# Patient Record
Sex: Female | Born: 1960 | Hispanic: Yes | Marital: Single | Smoking: Never smoker
Health system: Southern US, Community
[De-identification: ages and names within clinical notes are randomized; demographics above are authoritative.]

## PROBLEM LIST (undated history)

## (undated) DIAGNOSIS — E785 Hyperlipidemia, unspecified: Secondary | ICD-10-CM

## (undated) DIAGNOSIS — N801 Endometriosis of ovary: Secondary | ICD-10-CM

## (undated) DIAGNOSIS — M810 Age-related osteoporosis without current pathological fracture: Secondary | ICD-10-CM

## (undated) DIAGNOSIS — N80109 Endometriosis of ovary, unspecified side, unspecified depth: Secondary | ICD-10-CM

## (undated) DIAGNOSIS — M8580 Other specified disorders of bone density and structure, unspecified site: Secondary | ICD-10-CM

## (undated) HISTORY — DX: Age-related osteoporosis without current pathological fracture: M81.0

## (undated) HISTORY — DX: Endometriosis of ovary, unspecified side, unspecified depth: N80.109

## (undated) HISTORY — DX: Endometriosis of ovary: N80.1

## (undated) HISTORY — DX: Other specified disorders of bone density and structure, unspecified site: M85.80

## (undated) HISTORY — DX: Hyperlipidemia, unspecified: E78.5

## (undated) HISTORY — PX: OOPHORECTOMY: SHX86

---

## 1998-02-06 HISTORY — PX: SUPRACERVICAL ABDOMINAL HYSTERECTOMY: SHX5393

## 1998-02-06 HISTORY — PX: VAGINAL HYSTERECTOMY: SUR661

## 2008-02-07 HISTORY — PX: CYST REMOVAL HAND: SHX6279

## 2011-06-01 ENCOUNTER — Encounter: Payer: Self-pay | Admitting: Family Medicine

## 2011-07-11 ENCOUNTER — Ambulatory Visit: Payer: Self-pay | Admitting: Family Medicine

## 2011-07-11 ENCOUNTER — Ambulatory Visit (INDEPENDENT_AMBULATORY_CARE_PROVIDER_SITE_OTHER): Payer: Self-pay | Admitting: Family Medicine

## 2011-07-11 ENCOUNTER — Encounter: Payer: Self-pay | Admitting: Family Medicine

## 2011-07-11 VITALS — BP 106/70 | HR 63 | Ht 63.0 in | Wt 143.8 lb

## 2011-07-11 DIAGNOSIS — K5900 Constipation, unspecified: Secondary | ICD-10-CM

## 2011-07-11 DIAGNOSIS — Z0000 Encounter for general adult medical examination without abnormal findings: Secondary | ICD-10-CM

## 2011-07-11 DIAGNOSIS — Z01419 Encounter for gynecological examination (general) (routine) without abnormal findings: Secondary | ICD-10-CM | POA: Insufficient documentation

## 2011-07-11 DIAGNOSIS — E785 Hyperlipidemia, unspecified: Secondary | ICD-10-CM

## 2011-07-11 NOTE — Assessment & Plan Note (Signed)
Likely cause of rectal bleeding.  Instructed on Miralax use.  Will need routine colonoscopy once has orange card.

## 2011-07-11 NOTE — Assessment & Plan Note (Signed)
Last checked 3 months ago in [place]. Taking fish oil.

## 2011-07-11 NOTE — Assessment & Plan Note (Signed)
Despite hysterectomy, pt still needs paps-- still has cervix per pt report.  Will need colonoscopy and mammogram once has orange card.

## 2011-07-11 NOTE — Patient Instructions (Signed)
It was nice to meet you.  Try Miralax for your constipation.  Please come back once you have the orange card for your pap smear and colonoscopy.  We are giving you information for your mammogram.

## 2011-07-11 NOTE — Progress Notes (Signed)
S: Pt comes in today for new patient visit.  Hemorrhoids: BRBPR, has problem with constipation, bought suppository and it helped (3 days ago).  Usually has 1-2 BM per day but they are hard. Has long standing history of problems with constipation.  Has used OTC medicine in the past, unsure of the name.  Past medical, social, family, and surgical histories discussed as above.    ROS: Per HPI  History  Smoking status  . Never Smoker   Smokeless tobacco  . Not on file    O:  Filed Vitals:   07/11/11 1420  BP: 106/70  Pulse: 63    Gen: NAD CV: RRR, no murmur Pulm: CTA bilat, no wheezes or crackles Abd: soft, NT Ext: Warm, no chronic skin changes, no edema   A/P: 51 y.o. female p/w new pt visit, HLD, constipation -See problem list -f/u in once has orange card

## 2011-10-10 ENCOUNTER — Encounter: Payer: Self-pay | Admitting: Family Medicine

## 2011-10-19 ENCOUNTER — Encounter: Payer: Self-pay | Admitting: Family Medicine

## 2011-10-19 ENCOUNTER — Ambulatory Visit (INDEPENDENT_AMBULATORY_CARE_PROVIDER_SITE_OTHER): Payer: Self-pay | Admitting: Family Medicine

## 2011-10-19 ENCOUNTER — Other Ambulatory Visit (HOSPITAL_COMMUNITY)
Admission: RE | Admit: 2011-10-19 | Discharge: 2011-10-19 | Disposition: A | Discharge status: Home / Self Care | Payer: Self-pay | Source: Ambulatory Visit | Attending: Family Medicine | Admitting: Family Medicine

## 2011-10-19 VITALS — BP 105/54 | HR 63 | Temp 98.1°F | Ht 65.0 in | Wt 143.9 lb

## 2011-10-19 DIAGNOSIS — Z23 Encounter for immunization: Secondary | ICD-10-CM

## 2011-10-19 DIAGNOSIS — M8580 Other specified disorders of bone density and structure, unspecified site: Secondary | ICD-10-CM | POA: Insufficient documentation

## 2011-10-19 DIAGNOSIS — K5900 Constipation, unspecified: Secondary | ICD-10-CM

## 2011-10-19 DIAGNOSIS — Z01419 Encounter for gynecological examination (general) (routine) without abnormal findings: Secondary | ICD-10-CM

## 2011-10-19 DIAGNOSIS — K625 Hemorrhage of anus and rectum: Secondary | ICD-10-CM

## 2011-10-19 DIAGNOSIS — E785 Hyperlipidemia, unspecified: Secondary | ICD-10-CM

## 2011-10-19 DIAGNOSIS — Z0000 Encounter for general adult medical examination without abnormal findings: Secondary | ICD-10-CM

## 2011-10-19 DIAGNOSIS — M899 Disorder of bone, unspecified: Secondary | ICD-10-CM

## 2011-10-19 DIAGNOSIS — Z124 Encounter for screening for malignant neoplasm of cervix: Secondary | ICD-10-CM

## 2011-10-19 DIAGNOSIS — E8940 Asymptomatic postprocedural ovarian failure: Secondary | ICD-10-CM | POA: Insufficient documentation

## 2011-10-19 LAB — COMPREHENSIVE METABOLIC PANEL
ALT: 16 U/L (ref 0–35)
AST: 15 U/L (ref 0–37)
Albumin: 4.7 g/dL (ref 3.5–5.2)
BUN: 16 mg/dL (ref 6–23)
Calcium: 9.3 mg/dL (ref 8.4–10.5)
Chloride: 106 mEq/L (ref 96–112)
Potassium: 4.1 mEq/L (ref 3.5–5.3)
Total Protein: 7 g/dL (ref 6.0–8.3)

## 2011-10-19 LAB — CBC
MCHC: 35 g/dL (ref 30.0–36.0)
Platelets: 233 10*3/uL (ref 150–400)
RDW: 12.7 % (ref 11.5–15.5)

## 2011-10-19 LAB — LIPID PANEL
Cholesterol: 232 mg/dL — ABNORMAL HIGH (ref 0–200)
Triglycerides: 97 mg/dL (ref <150)

## 2011-10-19 NOTE — Progress Notes (Signed)
  Subjective:     Patricia Mitchell is a 50 y.o. female and is here for a comprehensive physical exam. The patient reports no problems. -Wondering if she needs to have bone density study-- had one done in [place] ~1 year ago and was "one step away" from osteoporosis; was started on denosumab every 6 months at that time; RF for osteoporosis include: early menopause (surgically induced when pt was in her 41's), otherwise pt is a non-smoker; not on Ca/Vit D   History   Social History  . Marital Status: Single    Spouse Name: N/A    Number of Children: N/A  . Years of Education: N/A   Occupational History  . Not on file.   Social History Main Topics  . Smoking status: Never Smoker   . Smokeless tobacco: Never Used  . Alcohol Use: No  . Drug Use: No  . Sexually Active: No   Other Topics Concern  . Not on file   Social History Narrative   Spanish speakingHeard about [place] from a family memberLives at home with 51 yo brotherLast mammogram in 1996 (normal)Last PAP in 2002 (normal)   Health Maintenance  Topic Date Due  . Pap Smear  04/01/1978  . Tetanus/tdap  04/02/1979  . Mammogram  04/01/2010  . Colonoscopy  04/01/2010  . Influenza Vaccine  11/07/2011    The following portions of the patient's history were reviewed and updated as appropriate: allergies, current medications, past family history, past medical history, past social history, past surgical history and problem list.  Review of Systems Constitutional: negative for fevers, sweats and weight loss Ears, nose, mouth, throat, and face: negative for nasal congestion and sore throat Respiratory: negative for cough and wheezing Cardiovascular: negative for chest pain, chest pressure/discomfort, palpitations and syncope Gastrointestinal: positive for constipation, negative for diarrhea, nausea and vomiting Genitourinary:negative for vaginal discharge and dysuria Integument/breast: negative for breast lump, breast tenderness and nipple  discharge Musculoskeletal:positive for arthralgias, negative for bone pain and muscle weakness Neurological: positive for dizziness and headaches, negative for coordination problems, gait problems and weakness   Objective:    BP 105/54  Pulse 63  Temp 98.1 F (36.7 C) (Oral)  Ht 5\' 5"  (1.651 m)  Wt 143 lb 14.4 oz (65.273 kg)  BMI 23.95 kg/m2 General appearance: alert, cooperative, appears stated age and no distress Head: Normocephalic, without obvious abnormality, atraumatic Throat: lips, mucosa, and tongue normal; teeth and gums normal Neck: no adenopathy, no carotid bruit, supple, symmetrical, trachea midline and thyroid not enlarged, symmetric, no tenderness/mass/nodules Lungs: clear to auscultation bilaterally Heart: regular rate and rhythm, S1, S2 normal, no murmur, click, rub or gallop Abdomen: soft, non-tender; bowel sounds normal; no masses,  no organomegaly Pelvic: cervix normal in appearance, external genitalia normal, no adnexal masses or tenderness, no cervical motion tenderness, rectovaginal septum normal, uterus surgically absent and vagina normal without discharge Extremities: extremities normal, atraumatic, no cyanosis or edema Pulses: 2+ and symmetric Skin: Skin color, texture, turgor normal. No rashes or lesions Neurologic: Grossly normal    Assessment:    Healthy female exam.      Plan:     See After Visit Summary for Counseling Recommendations

## 2011-10-19 NOTE — Assessment & Plan Note (Signed)
Check FLP today. 

## 2011-10-19 NOTE — Assessment & Plan Note (Signed)
Better with increased fiber/fruit in diet

## 2011-10-19 NOTE — Assessment & Plan Note (Signed)
Pap done today- pt with h/o hysterectomy but with cervix intact.  Will also check baseline CMET and CBC. Pt encouraged to get mammogram and will attempt to get colonoscopy.  Pt due to Tdap, which was also given today.

## 2011-10-19 NOTE — Patient Instructions (Addendum)
  It was good to see you today.  I will send you a letter with your lab and pap smear results.  Make sure you get your mammogram!  I will work on getting a colonoscopy set up for you.  We gave you your tetanus shot.  You can take Vitamin D with or without Calcium (over the counter).  We will look into getting a bone scan.  Come back and see me in 1 year, sooner for any issues.

## 2011-10-19 NOTE — Assessment & Plan Note (Signed)
Patient reports bone density scan in [place] ~1 year ago showed that she "almost" had osteoporosis (I would assume osteopenia).  No fractures/broken bones; only risk factor is early surgical menopause when she was in her 30's.  It would be reasonable to get DEXA.  Rec OTC Vit D +/- Ca.  Pt reports being Rx'ed Denosumab q6 months when in [place].

## 2011-10-20 ENCOUNTER — Other Ambulatory Visit: Payer: Self-pay | Admitting: Family Medicine

## 2011-10-20 ENCOUNTER — Encounter: Payer: Self-pay | Admitting: Family Medicine

## 2011-10-20 DIAGNOSIS — Z1231 Encounter for screening mammogram for malignant neoplasm of breast: Secondary | ICD-10-CM

## 2011-10-24 ENCOUNTER — Telehealth: Payer: Self-pay | Admitting: Family Medicine

## 2011-10-24 NOTE — Telephone Encounter (Signed)
I called pt and spoke with sister I LM. Pt hopefully will call us back and we will inform pt about future appt that has been sch.  [employer]

## 2011-10-24 NOTE — Telephone Encounter (Signed)
Thank you, [employer]. Could you mail a letter with the appt info? Thanks, .Arlyss Repress

## 2011-10-25 ENCOUNTER — Encounter: Payer: Self-pay | Admitting: Family Medicine

## 2011-10-26 ENCOUNTER — Encounter: Payer: Self-pay | Admitting: Family Medicine

## 2011-10-31 ENCOUNTER — Telehealth: Payer: Self-pay | Admitting: Family Medicine

## 2011-10-31 MED ORDER — PRAVASTATIN SODIUM 40 MG PO TABS: 40.0000 mg | ORAL_TABLET | Freq: Every day | ORAL | Status: DC

## 2011-10-31 NOTE — Telephone Encounter (Signed)
Will fwd. To Dr.McGill for review. .Jorian [place]  

## 2011-10-31 NOTE — Telephone Encounter (Signed)
Sent to pharmacy 

## 2011-10-31 NOTE — Telephone Encounter (Signed)
After pt received letter where we informed her about high cholesterol pt will like PCP send medicine to the pharmacy that is in our system.   [employer]

## 2011-11-07 ENCOUNTER — Ambulatory Visit (HOSPITAL_COMMUNITY)
Admission: RE | Admit: 2011-11-07 | Discharge: 2011-11-07 | Disposition: A | Discharge status: Home / Self Care | Payer: Self-pay | Source: Ambulatory Visit | Attending: Family Medicine | Admitting: Family Medicine

## 2011-11-07 DIAGNOSIS — Z13820 Encounter for screening for osteoporosis: Secondary | ICD-10-CM | POA: Insufficient documentation

## 2011-11-07 DIAGNOSIS — M899 Disorder of bone, unspecified: Secondary | ICD-10-CM | POA: Insufficient documentation

## 2011-11-07 DIAGNOSIS — E8940 Asymptomatic postprocedural ovarian failure: Secondary | ICD-10-CM

## 2011-11-07 DIAGNOSIS — Z780 Asymptomatic menopausal state: Secondary | ICD-10-CM | POA: Insufficient documentation

## 2011-11-07 DIAGNOSIS — Z1231 Encounter for screening mammogram for malignant neoplasm of breast: Secondary | ICD-10-CM | POA: Insufficient documentation

## 2011-11-07 DIAGNOSIS — M8580 Other specified disorders of bone density and structure, unspecified site: Secondary | ICD-10-CM

## 2011-11-07 IMAGING — MG MM DIGITAL SCREENING BILAT
5 series · 5 of 5 positions shown · non-contrast
Comparison: None.

CLINICAL DATA: Screening.

DIGITAL BILATERAL SCREENING MAMMOGRAM WITH CAD

[R CC]
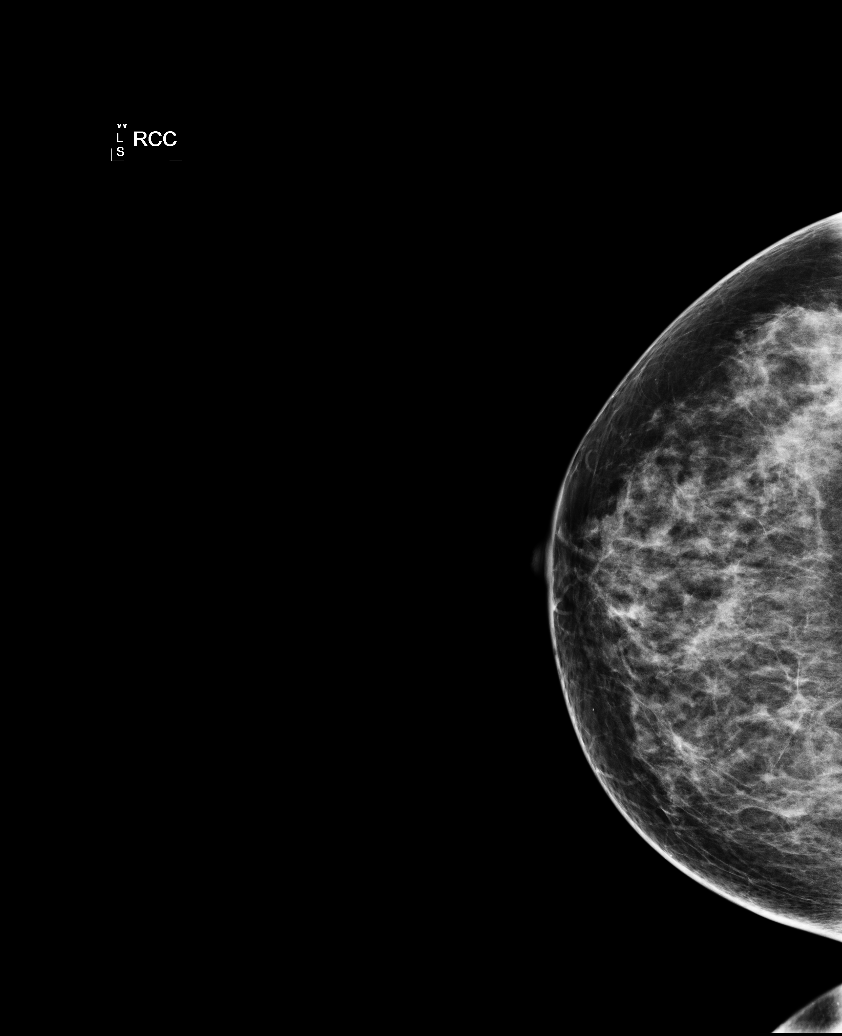

[R MLO]
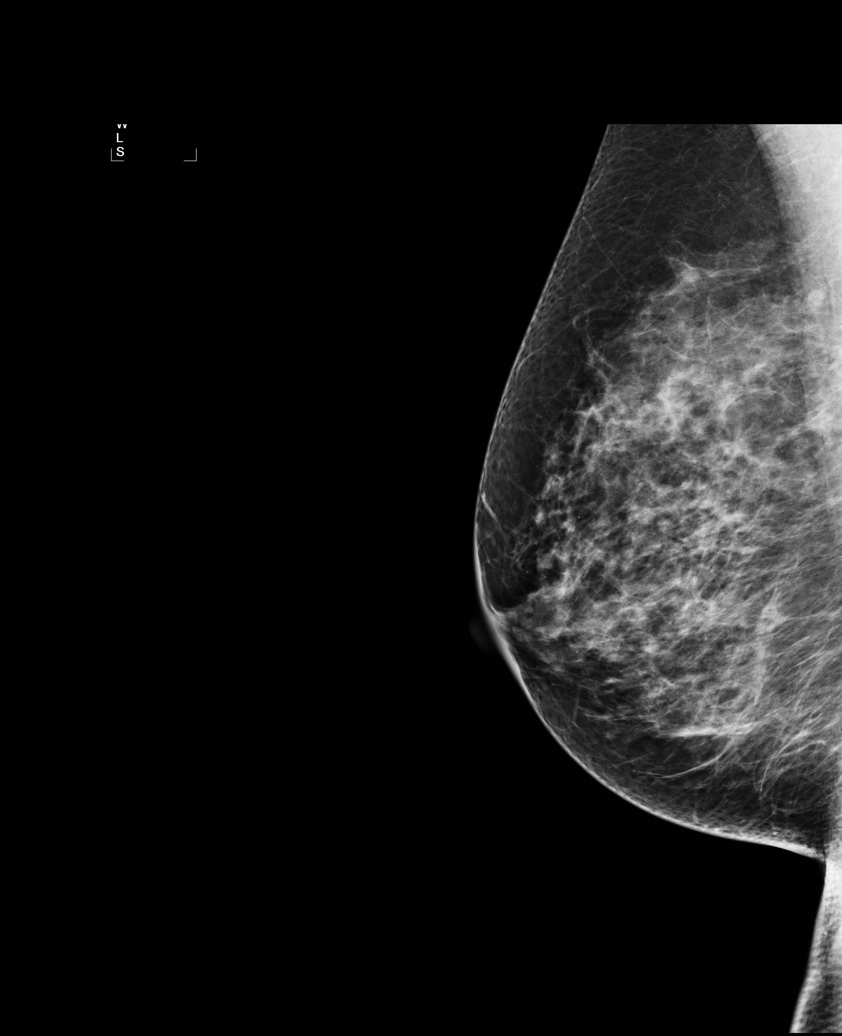

[L CC]
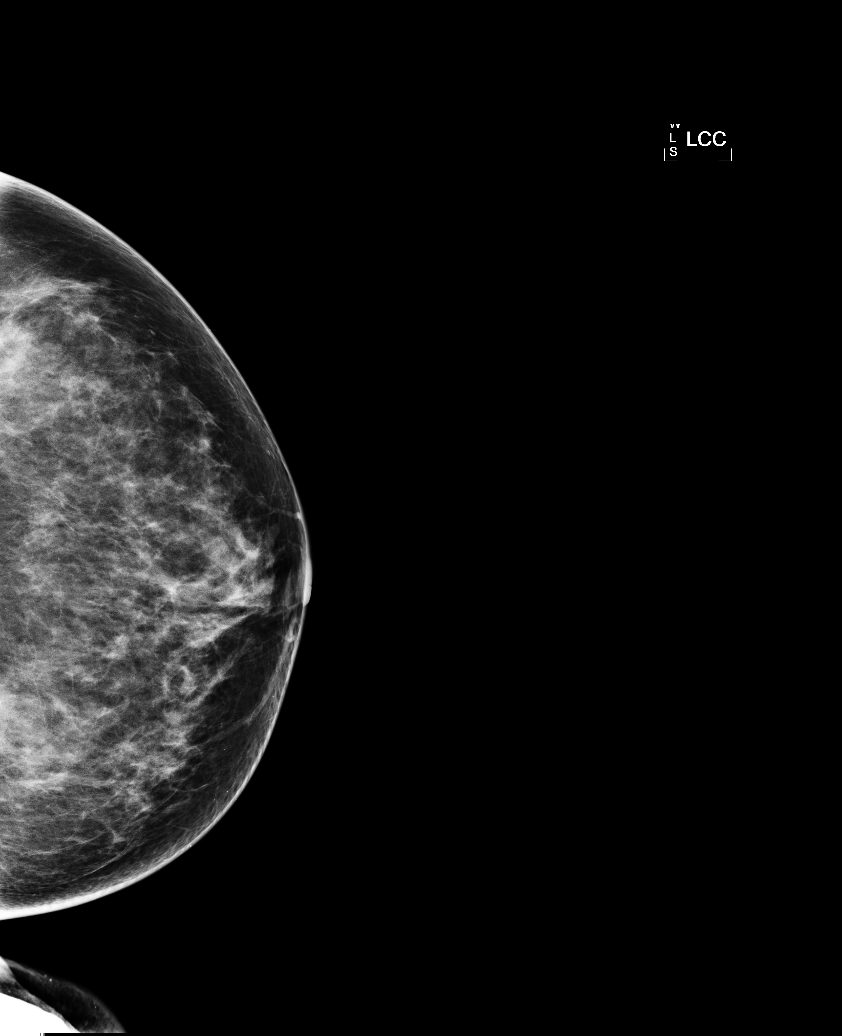

[L MLO (1 of 2)]
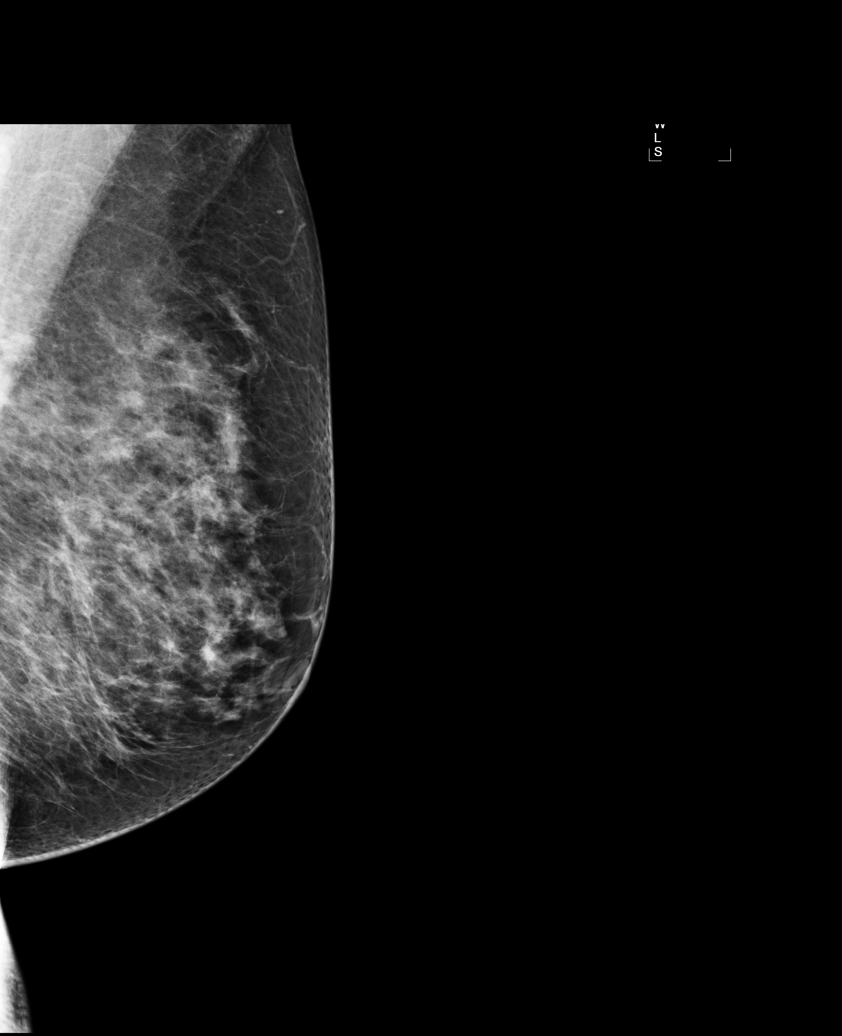

[L MLO (2 of 2)]
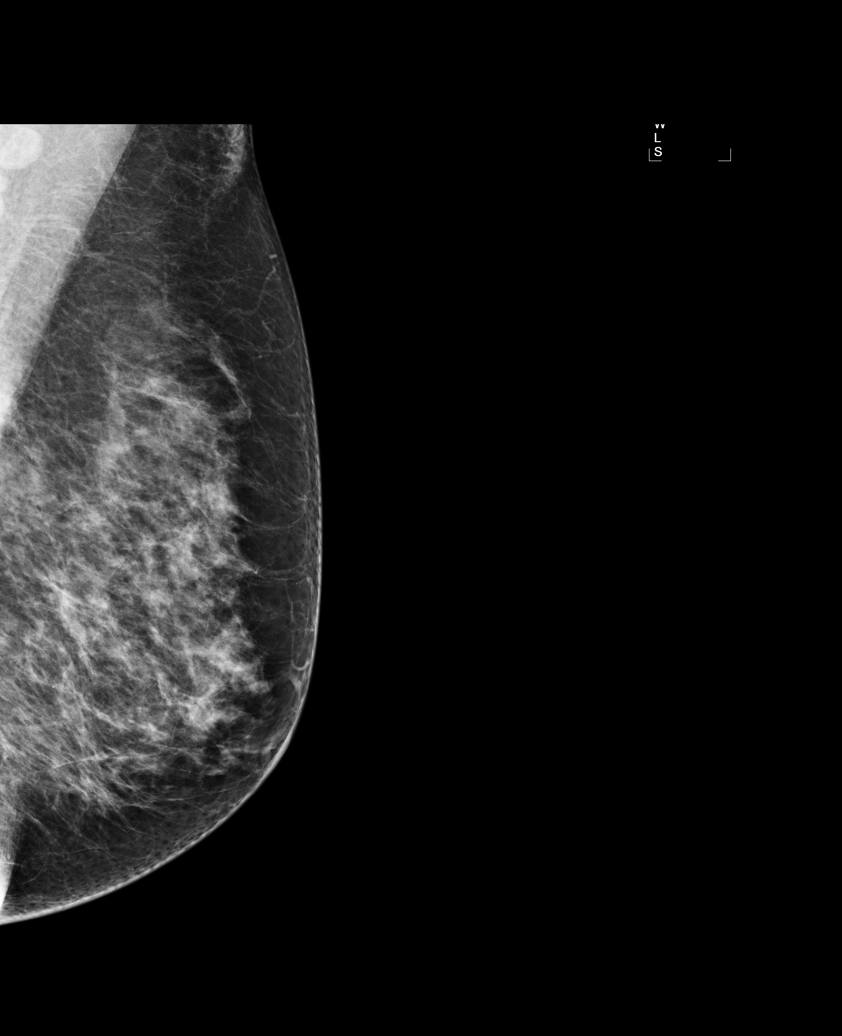

[5 of 5 positions shown; findings below may reference images not displayed]

FINDINGS: The breast tissue is extremely dense. No suspicious
masses, architectural distortion, or calcifications are present.

Images were processed with CAD.
IMPRESSION: No mammographic evidence of malignancy.
A result letter of this screening mammogram will be mailed directly
to the patient.

RECOMMENDATION:
Screening mammogram in one year. (Code:[Z5])

BI-RADS CATEGORY 1:  Negative.

## 2011-11-08 ENCOUNTER — Encounter: Payer: Self-pay | Admitting: Family Medicine

## 2011-11-08 ENCOUNTER — Telehealth: Payer: Self-pay | Admitting: Family Medicine

## 2011-11-08 NOTE — Telephone Encounter (Signed)
Please call pt and let her know that her mammogram was fine. Her bone scan showed osteopenia (not as bad as osteoporosis, but she is at risk for developing this).  I want her to start 600-800IU of vitamin D (was already rec'ed 10/19/11 at [facility] to start over the counter Vit D +/- calcium), weight bearing exercises, and healthy diet to help prevent progression to osteoporosis.  F/u is recommended in 11/2013 (2 years).  Thanks!

## 2011-11-09 NOTE — Telephone Encounter (Signed)
Called patient to deliver message from Dr Fara Boros but she does not speak any English, will forward to [employer] to call patient.Malea Swilling, Rodena Medin

## 2011-11-10 NOTE — Telephone Encounter (Signed)
Pt is aware about Dr's recommendation also about  mammograph result and bone scan result.  [employer]

## 2013-03-26 ENCOUNTER — Encounter: Payer: Self-pay | Admitting: Internal Medicine

## 2013-04-23 ENCOUNTER — Ambulatory Visit (AMBULATORY_SURGERY_CENTER): Payer: Self-pay | Admitting: *Deleted

## 2013-04-23 VITALS — Ht 65.0 in | Wt 154.4 lb

## 2013-04-23 DIAGNOSIS — Z1211 Encounter for screening for malignant neoplasm of colon: Secondary | ICD-10-CM

## 2013-04-23 MED ORDER — MOVIPREP 100 G PO SOLR: ORAL | Status: DC

## 2013-04-23 NOTE — Progress Notes (Signed)
No allergies to eggs or soy. No problems with anesthesia.  Interpreter provided by [facility] present during PV

## 2013-04-25 ENCOUNTER — Encounter: Payer: Self-pay | Admitting: Internal Medicine

## 2013-05-07 ENCOUNTER — Encounter: Payer: Self-pay | Admitting: Internal Medicine

## 2013-05-07 ENCOUNTER — Ambulatory Visit (AMBULATORY_SURGERY_CENTER): Payer: BC Managed Care – PPO | Admitting: Internal Medicine

## 2013-05-07 VITALS — BP 87/46 | HR 51 | Temp 97.5°F | Resp 16 | Ht 65.0 in | Wt 154.0 lb

## 2013-05-07 DIAGNOSIS — Z1211 Encounter for screening for malignant neoplasm of colon: Secondary | ICD-10-CM

## 2013-05-07 DIAGNOSIS — D126 Benign neoplasm of colon, unspecified: Secondary | ICD-10-CM

## 2013-05-07 MED ORDER — SODIUM CHLORIDE 0.9 % IV SOLN: 500.0000 mL | INTRAVENOUS | Status: DC

## 2013-05-07 NOTE — Progress Notes (Signed)
Called to room to assist during endoscopic procedure.  Patient ID and intended procedure confirmed with present staff. Received instructions for my participation in the procedure from the performing physician.Called to room to assist during endoscopic procedure.  Patient ID and intended procedure confirmed with present staff. Received instructions for my participation in the procedure from the performing physician. 

## 2013-05-07 NOTE — Op Note (Signed)
[facility]  [address] [place], [id number]   COLONOSCOPY PROCEDURE REPORT  PATIENT: Patricia Mitchell, Patricia Mitchell  MR#: [id number] BIRTHDATE: [birth year] , 67  yrs. old GENDER: Female ENDOSCOPIST: Jerene Bears, MD REFERRED BY: Sherley Bounds. Wendi Snipes, MD PROCEDURE DATE:  05/07/2013 PROCEDURE:   Colonoscopy with cold biopsy polypectomy First Screening Colonoscopy - Avg.  risk and is 50 yrs.  old or older Yes.  Prior Negative Screening - Now for repeat screening. N/A  History of Adenoma - Now for follow-up colonoscopy & has been > or = to 3 yrs.  N/A  Polyps Removed Today? Yes. ASA CLASS:   Class II INDICATIONS:average risk screening and first colonoscopy. MEDICATIONS: MAC sedation, administered by CRNA and propofol (Diprivan) 250mg  IV  DESCRIPTION OF PROCEDURE:   After the risks benefits and alternatives of the procedure were thoroughly explained, informed consent was obtained.  A digital rectal exam revealed no rectal mass.   The LB [id number]  endoscope was introduced through the anus and advanced to the cecum, which was identified by both the appendix and ileocecal valve. No adverse events experienced. The quality of the prep was good, using MoviPrep  The instrument was then slowly withdrawn as the colon was fully examined.   COLON FINDINGS: A sessile polyp measuring 2 mm in size was found in the ascending colon.  A polypectomy was performed with cold forceps.  The resection was complete and the polyp tissue was completely retrieved.   Mild diverticulosis was noted in the ascending colon and at the hepatic flexure.   Moderate melanosis was found The finding was in the right colon.  Retroflexed views revealed no abnormalities. The time to cecum=2 minutes 08 seconds. Withdrawal time=9 minutes 50 seconds.  The scope was withdrawn and the procedure completed. COMPLICATIONS: There were no complications.  ENDOSCOPIC IMPRESSION: 1.   Sessile polyp measuring 2 mm in size was  found in the ascending colon; polypectomy was performed with cold forceps 2.   Mild diverticulosis was noted in the ascending colon and at the hepatic flexure 3.   Moderate melanosis was found in the right colon  RECOMMENDATIONS: 1.  Await pathology results 2.  High fiber diet 3.  If the polyp removed today is proven to be an adenomatous (pre-cancerous) polyp, you will need a repeat colonoscopy in 5 years.  Otherwise you should continue to follow colorectal cancer screening guidelines for "routine risk" patients with colonoscopy in 10 years.  You will receive a letter within 1-2 weeks with the results of your biopsy as well as final recommendations.  Please call my office if you have not received a letter after 3 weeks.   eSigned:  Jerene Bears, MD 05/07/2013 9:19 AM      cc: The Patient, Sherley Bounds. Wendi Snipes, MD

## 2013-05-07 NOTE — Progress Notes (Signed)
Patient denies any allergies to eggs or soy. Patient denies any problems with anesthesia.  

## 2013-05-07 NOTE — Progress Notes (Signed)
Report to pacu rn, bbs=clear, vss

## 2013-05-07 NOTE — Patient Instructions (Signed)
YOU HAD AN ENDOSCOPIC PROCEDURE TODAY AT THE [facility]: Refer to the procedure report that was given to you for any specific questions about what was found during the examination.  If the procedure report does not answer your questions, please call your gastroenterologist to clarify.  If you requested that your care partner not be given the details of your procedure findings, then the procedure report has been included in a sealed envelope for you to review at your convenience later.  YOU SHOULD EXPECT: Some feelings of bloating in the abdomen. Passage of more gas than usual.  Walking can help get rid of the air that was put into your GI tract during the procedure and reduce the bloating. If you had a lower endoscopy (such as a colonoscopy or flexible sigmoidoscopy) you may notice spotting of blood in your stool or on the toilet paper. If you underwent a bowel prep for your procedure, then you may not have a normal bowel movement for a few days.  DIET: Your first meal following the procedure should be a light meal and then it is ok to progress to your normal diet.  A half-sandwich or bowl of soup is an example of a good first meal.  Heavy or fried foods are harder to digest and may make you feel nauseous or bloated.  Likewise meals heavy in dairy and vegetables can cause extra gas to form and this can also increase the bloating.  Drink plenty of fluids but you should avoid alcoholic beverages for 24 hours.  ACTIVITY: Your care partner should take you home directly after the procedure.  You should plan to take it easy, moving slowly for the rest of the day.  You can resume normal activity the day after the procedure however you should NOT DRIVE or use heavy machinery for 24 hours (because of the sedation medicines used during the test).    SYMPTOMS TO REPORT IMMEDIATELY: A gastroenterologist can be reached at any hour.  During normal business hours, 8:30 AM to 5:00 PM Monday through Friday,  call [phone].  After hours and on weekends, please call the GI answering service at [phone] who will take a message and have the physician on call contact you.   Following lower endoscopy (colonoscopy or flexible sigmoidoscopy):  Excessive amounts of blood in the stool  Significant tenderness or worsening of abdominal pains  Swelling of the abdomen that is new, acute  Fever of 100F or higher    FOLLOW UP: If any biopsies were taken you will be contacted by phone or by letter within the next 1-3 weeks.  Call your gastroenterologist if you have not heard about the biopsies in 3 weeks.  Our staff will call the home number listed on your records the next business day following your procedure to check on you and address any questions or concerns that you may have at that time regarding the information given to you following your procedure. This is a courtesy call and so if there is no answer at the home number and we have not heard from you through the emergency physician on call, we will assume that you have returned to your regular daily activities without incident.  SIGNATURES/CONFIDENTIALITY: You and/or your care partner have signed paperwork which will be entered into your electronic medical record.  These signatures attest to the fact that that the information above on your After Visit Summary has been reviewed and is understood.  Full responsibility of the confidentiality   of this discharge information lies with you and/or your care-partner.     

## 2013-05-08 ENCOUNTER — Telehealth: Payer: Self-pay

## 2013-05-08 NOTE — Telephone Encounter (Signed)
  Follow up Call-  Call back number 05/07/2013  Post procedure Call Back phone  # [phone]  Permission to leave phone message No     Patient questions:  Do you have a fever, pain , or abdominal swelling? no Pain Score  0 *  Have you tolerated food without any problems? yes  Have you been able to return to your normal activities? yes  Do you have any questions about your discharge instructions: Diet   no Medications  no Follow up visit  no  Do you have questions or concerns about your Care? no  Actions: * If pain score is 4 or above: No action needed, pain <4.

## 2013-05-18 ENCOUNTER — Encounter: Payer: Self-pay | Admitting: Internal Medicine

## 2013-05-23 ENCOUNTER — Encounter: Payer: Self-pay | Admitting: Family Medicine

## 2013-05-23 DIAGNOSIS — D369 Benign neoplasm, unspecified site: Secondary | ICD-10-CM | POA: Insufficient documentation

## 2016-04-19 ENCOUNTER — Encounter (HOSPITAL_COMMUNITY): Payer: Self-pay | Admitting: Emergency Medicine

## 2016-04-19 ENCOUNTER — Emergency Department (HOSPITAL_COMMUNITY): Payer: BLUE CROSS/BLUE SHIELD

## 2016-04-19 ENCOUNTER — Emergency Department (HOSPITAL_COMMUNITY)
Admission: EM | Admit: 2016-04-19 | Discharge: 2016-04-19 | Disposition: A | Discharge status: Home / Self Care | Payer: BLUE CROSS/BLUE SHIELD | Attending: Emergency Medicine | Admitting: Emergency Medicine

## 2016-04-19 DIAGNOSIS — Z79899 Other long term (current) drug therapy: Secondary | ICD-10-CM | POA: Insufficient documentation

## 2016-04-19 DIAGNOSIS — J069 Acute upper respiratory infection, unspecified: Secondary | ICD-10-CM | POA: Insufficient documentation

## 2016-04-19 DIAGNOSIS — R05 Cough: Secondary | ICD-10-CM | POA: Diagnosis present

## 2016-04-19 DIAGNOSIS — B9789 Other viral agents as the cause of diseases classified elsewhere: Secondary | ICD-10-CM

## 2016-04-19 MED ORDER — OXYMETAZOLINE HCL 0.05 % NA SOLN: 1.0000 | Freq: Two times a day (BID) | NASAL | 0 refills | Status: DC

## 2016-04-19 NOTE — ED Notes (Signed)
Patient transported to X-ray 

## 2016-04-19 NOTE — ED Provider Notes (Signed)
[place] DEPT Provider Note    By signing my name below, I, Bea Graff, attest that this documentation has been prepared under the direction and in the presence of Harlene Ramus, PA-C. Electronically Signed: Bea Graff, ED Scribe. 04/19/16. 7:44 PM.   History   Chief Complaint Chief Complaint  Patient presents with  . Cough  . Sore Throat   The history is provided by the patient and medical records. No language interpreter was used.    Patricia Mitchell is a 56 y.o. female who presents to the Emergency Department complaining of flu like symptoms for the past 7 weeks. She reports associated productive cough of phlegm, nasal congestion, sinus pain/pressure, sore throat, otalgia, wheezing at night, and generalized body aches. Pt states she has been seen by her PCP three times and was prescribed Hycodan, Tessalon, Promethazine with Codeine and Augmentin that she has been taking for the past four days. She also reports taking Claritin at onset of her symptoms and reports no resolution so she discontinued taking it. She was told she had allergies and bronchitis. She states her symptoms have not improved at all since onset. She denies any modifying factors. She denies fever, chills, drainage from the ears, hemoptysis, abdominal pain, nausea, vomiting, diarrhea, rash, eye itching or drainage, difficulty breathing or swallowing. She denies any recent hospitalizations. She denies any known sick contacts. She has not received a CXR.    Past Medical History:  Diagnosis Date  . Endometriosis of ovary   . Hyperlipidemia     Patient Active Problem List   Diagnosis Date Noted  . Adenomatous polyp 05/23/2013  . Osteopenia 10/19/2011  . 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 Hyperlipidemia 07/11/2011  . Constipation 07/11/2011  . Well woman exam 07/11/2011    Past Surgical History:  Procedure Laterality Date  . CYST REMOVAL HAND Bilateral 2010  . Foley   right taken out in 1995, left taken out in 1997 b/c of cysts  . VAGINAL HYSTERECTOMY  2000    OB History    No data available       Home Medications    Prior to Admission medications   Medication Sig Start Date End Date Taking? Authorizing Provider  Calcium Carb-Cholecalciferol (CALCIUM + D3 PO) Take by mouth daily.    Historical Provider, MD  Cholecalciferol (VITAMIN D PO) Take by mouth daily.    Historical Provider, MD  Misc Natural Products (JOINT SUPPORT PO) Take 2 capsules by mouth daily.    Historical Provider, MD  Multiple Vitamin (MULTIVITAMIN) tablet Take 1 tablet by mouth daily.    Historical Provider, MD  oxymetazoline (AFRIN NASAL SPRAY) 0.05 % nasal spray Place 1 spray into both nostrils 2 (two) times daily. Spray once into each nostril twice daily for up to the next 3 days. Do not use for more than 3 days to prevent rebound rhinorrhea. 04/19/16   Nona Dell, PA-C  RANITIDINE HCL PO Take by mouth daily.    Historical Provider, MD    Family History Family History  Problem Relation Age of Onset  . Prostate cancer Father   . Cervical cancer Mother   . Colon cancer Neg Hx     Social History Social History  Substance Use Topics  . Smoking status: Never Smoker  . Smokeless tobacco: Never Used  . Alcohol use No     Allergies   Patient has no known allergies.   Review of Systems Review of Systems  Constitutional: Negative for chills  and fever.  HENT: Positive for congestion, ear pain, sinus pain, sinus pressure and sore throat. Negative for trouble swallowing.   Eyes: Negative for discharge and itching.  Respiratory: Positive for cough and wheezing. Negative for shortness of breath.   Gastrointestinal: Negative for abdominal pain, nausea and vomiting.  Musculoskeletal: Positive for myalgias.     Physical Exam Updated Vital Signs BP 131/65 (BP Location: Right Arm)   Pulse 66   Temp 98 F (36.7 C) (Oral)   Resp 18   SpO2 98%   Physical  Exam  Constitutional: She is oriented to person, place, and time. She appears well-developed and well-nourished.  HENT:  Head: Normocephalic and atraumatic.  Right Ear: A middle ear effusion is present.  Left Ear: A middle ear effusion is present.  Nose: Rhinorrhea present. Right sinus exhibits maxillary sinus tenderness. Right sinus exhibits no frontal sinus tenderness. Left sinus exhibits maxillary sinus tenderness. Left sinus exhibits no frontal sinus tenderness.  Mouth/Throat: Uvula is midline, oropharynx is clear and moist and mucous membranes are normal. No oropharyngeal exudate, posterior oropharyngeal edema, posterior oropharyngeal erythema or tonsillar abscesses. No tonsillar exudate.  Post nasal drip.  Eyes: Conjunctivae and EOM are normal. Pupils are equal, round, and reactive to light. Right eye exhibits no discharge. Left eye exhibits no discharge. No scleral icterus.  Neck: Normal range of motion. Neck supple.  Cardiovascular: Normal rate, regular rhythm, normal heart sounds and intact distal pulses.  Exam reveals no gallop and no friction rub.   No murmur heard. Pulmonary/Chest: Effort normal and breath sounds normal. No respiratory distress. She has no wheezes. She has no rales. She exhibits no tenderness.  Abdominal: Soft. She exhibits no distension. There is no tenderness.  Musculoskeletal: Normal range of motion. She exhibits no edema.  Lymphadenopathy:    She has no cervical adenopathy.  Neurological: She is alert and oriented to person, place, and time.  Skin: Skin is warm and dry.  Nursing note and vitals reviewed.    ED Treatments / Results  DIAGNOSTIC STUDIES: Oxygen Saturation is 98% on RA, normal by my interpretation.   COORDINATION OF CARE: 7:36 PM- Encouraged pt to continue taking Augmentin. Will prescribe Flonase nasal spray. Explained to pt that imaging is not necessary due to exam but pt requested it. Pt verbalizes understanding and agrees to  plan.  Medications - No data to display  Labs (all labs ordered are listed, but only abnormal results are displayed) Labs Reviewed - No data to display  EKG  EKG Interpretation None       Radiology Dg Chest 2 View  Result Date: 04/19/2016 CLINICAL DATA:  Productive cough EXAM: CHEST  2 VIEW COMPARISON:  None. FINDINGS: The heart size and mediastinal contours are within normal limits. Both lungs are clear. The visualized skeletal structures are unremarkable. IMPRESSION: No active cardiopulmonary disease. Electronically Signed   By: Donavan Foil M.D.   On: 04/19/2016 20:12    Procedures Procedures (including critical care time)  Medications Ordered in ED Medications - No data to display   Initial Impression / Assessment and Plan / ED Course  I have reviewed the triage vital signs and the nursing notes.  Pertinent labs & imaging results that were available during my care of the patient were reviewed by me and considered in my medical decision making (see chart for details).     Patient complaining of symptoms of sinusitis/URI onset 7 weeks ago. Moderate symptoms have been present for greater than 10 days with  purulent nasal discharge and maxillary sinus pain. CXR negative. Concern for acute bacterial rhinosinusitis. Patient discharged with instructions to continue taking her rx of Augmentin. D/c home with rx of decongestant. Instructions given for warm saline nasal wash and recommendations for follow-up with primary care physician. Return precautions discussed.   I personally performed the services described in this documentation, which was scribed in my presence. The recorded information has been reviewed and is accurate.   Final Clinical Impressions(s) / ED Diagnoses   Final diagnoses:  Viral URI with cough    New Prescriptions New Prescriptions   OXYMETAZOLINE (AFRIN NASAL SPRAY) 0.05 % NASAL SPRAY    Place 1 spray into both nostrils 2 (two) times daily. Spray once  into each nostril twice daily for up to the next 3 days. Do not use for more than 3 days to prevent rebound rhinorrhea.     Chesley Noon [place], [place] 04/19/16 2037    Gwenyth Allegra Tegeler, MD 04/19/16 2045

## 2016-04-19 NOTE — Discharge Instructions (Signed)
Take your medication as prescribed to help with your congestion. I recommend continuing to take your cough medication as prescribed. Also continue taking your antibiotics as prescribed until completed. You may also trying drinking warm fluids to help with your congestion.  Please follow up with a primary care provider from the Resource Guide provided below in 4-5 days as needed. Please return to the Emergency Department if symptoms worsen or new onset of fever, facial swelling, difficulty swallowing resulting in drooling, difficulty breathing, chest pain, abdominal pain, vomiting.

## 2016-04-19 NOTE — ED Notes (Signed)
See EDP assessment 

## 2016-04-19 NOTE — ED Triage Notes (Signed)
Pt states for the last 2 weeks she has had URI with productive cough and sore throat and ear pain.

## 2017-01-01 ENCOUNTER — Encounter (INDEPENDENT_AMBULATORY_CARE_PROVIDER_SITE_OTHER): Payer: Self-pay | Admitting: Physician Assistant

## 2017-01-01 ENCOUNTER — Other Ambulatory Visit: Payer: Self-pay

## 2017-01-01 ENCOUNTER — Ambulatory Visit (INDEPENDENT_AMBULATORY_CARE_PROVIDER_SITE_OTHER): Payer: BLUE CROSS/BLUE SHIELD | Admitting: Physician Assistant

## 2017-01-01 VITALS — BP 134/74 | HR 79 | Temp 97.7°F | Wt 155.8 lb

## 2017-01-01 DIAGNOSIS — K8020 Calculus of gallbladder without cholecystitis without obstruction: Secondary | ICD-10-CM

## 2017-01-01 DIAGNOSIS — H8110 Benign paroxysmal vertigo, unspecified ear: Secondary | ICD-10-CM | POA: Diagnosis not present

## 2017-01-01 MED ORDER — NAPROXEN 500 MG PO TABS
500.0000 mg | ORAL_TABLET | Freq: Two times a day (BID) | ORAL | 0 refills | Status: DC
Start: 2017-01-01 — End: 2017-06-04

## 2017-01-01 MED ORDER — URSODIOL 250 MG PO TABS: 250.0000 mg | ORAL_TABLET | Freq: Two times a day (BID) | ORAL | 11 refills | Status: DC | PRN

## 2017-01-01 NOTE — Patient Instructions (Signed)
Vrtigo posicional benigno (Benign Positional Vertigo) El vrtigo es la sensacin de que usted o todo lo que lo rodea se mueven cuando en realidad eso no sucede. El vrtigo posicional benigno es el tipo de vrtigo ms comn. La causa de este trastorno no es grave (es benigna). Algunos movimientos y determinadas posiciones pueden desencadenar el trastorno (es posicional). El vrtigo posicional benigno puede ser peligroso si ocurre mientras est haciendo algo que podra suponer un riesgo para usted y para los dems, por ejemplo, conduciendo un automvil. CAUSAS En muchos de los casos, se desconoce la causa de este trastorno. Puede deberse a una alteracin en una zona del odo interno que ayuda al cerebro a percibir el movimiento y a coordinar el equilibrio. Esta alteracin puede deberse a una infeccin viral (laberintitis), a una lesin en la cabeza o a los movimientos reiterados. FACTORES DE RIESGO Es ms probable que esta afeccin se manifieste en:  Las mujeres.  Las personas mayores de 50aos. SNTOMAS Generalmente, los sntomas de este trastorno se presentan al mover la cabeza o los ojos en diferentes direcciones. Pueden aparecer repentinamente y suelen durar menos de un minuto. Entre los sntomas se pueden incluir los siguientes:  Prdida del equilibrio y cadas.  Sensacin de estar dando vueltas o movindose.  Sensacin de que el entorno est dando vueltas o movindose.  Nuseas y vmitos.  Visin borrosa.  Mareos.  Movimientos oculares involuntarios (nistagmo). Los sntomas pueden ser leves y algo fastidiosos, o pueden ser graves e interferir en la vida cotidiana. Los episodios de vrtigo posicional benigno pueden repetirse (ser recurrentes) a lo largo del tiempo, y algunos movimientos pueden desencadenarlos. Los sntomas pueden mejorar con el tiempo. DIAGNSTICO Generalmente, este trastorno se diagnostica con una historia clnica y un examen fsico de la cabeza, el cuello y los  odos. Tal vez lo deriven a un mdico especialista en problemas de la garganta, la nariz y el odo (otorrinolaringlogo), o a uno que se especializa en trastornos del sistema nervioso (neurlogo). Pueden hacerle otros estudios, entre ellos:  Resonancia magntica.  Tomografa computarizada.  Estudios de los movimientos oculares. El mdico puede pedirle que cambie rpidamente de posicin mientras observa si se presentan sntomas de vrtigo posicional benigno, por ejemplo, nistagmo. Los movimientos oculares se pueden estudiar con una electronistagmografa (ENG), con estimulacin trmica, mediante la maniobra de Dix-Hallpike o con la prueba de rotacin.  Electroencefalograma (EEG). Este estudio registra la actividad elctrica del cerebro.  Pruebas de audicin. TRATAMIENTO Generalmente, para tratar este trastorno, el mdico le har movimientos especficos con la cabeza para que el odo interno se normalice. Cuando los casos son graves, tal vez haya que realizar una ciruga, pero esto no es frecuente. En algunos casos, el vrtigo posicional benigno se resuelve por s solo en el trmino de 2 o 4semanas. INSTRUCCIONES PARA EL CUIDADO EN EL HOGAR Seguridad  Muvase lentamente.No haga movimientos bruscos con el cuerpo o con la cabeza.  No conduzca.  No opere maquinaria pesada.  No haga ninguna tarea que podra ser peligrosa para usted o para otras personas en caso de que ocurriera un episodio de vrtigo.  Si tiene dificultad para caminar o mantener el equilibrio, use un bastn para mantener la estabilidad. Si se siente mareado o inestable, sintese de inmediato.  Reanude sus actividades normales como se lo haya indicado el mdico. Pregntele al mdico qu actividades son seguras para usted. Instrucciones generales  Tome los medicamentos de venta libre y los recetados solamente como se lo haya indicado el mdico.    Evite algunas posiciones o determinados movimientos como se lo haya indicado el  mdico.  Beba suficiente lquido para mantener la orina clara o de color amarillo plido.  Concurra a todas las visitas de control como se lo haya indicado el mdico. Esto es importante. SOLICITE ATENCIN MDICA SI:  Patricia Mitchell.  El trastorno [name], o le aparecen sntomas nuevos.  Sus familiares o amigos advierten cambios en su comportamiento.  Las nuseas o los vmitos empeoran.  Tiene sensacin de hormigueo o de adormecimiento. SOLICITE ATENCIN MDICA DE INMEDIATO SI:  Tiene dificultad para hablar o para moverse.  Esta mareado todo [name].  Se desmaya.  Tiene dolores de cabeza intensos.  Tiene debilidad en los brazos o las piernas.  Tiene cambios en la audicin o la visin.  Siente rigidez en el cuello.  Tiene sensibilidad a Naval architect. Esta informacin no tiene Marine scientist el consejo del mdico. Asegrese de hacerle al mdico cualquier pregunta que tenga. Document Released: 05/11/2008 Document Revised: 10/14/2014 Document Reviewed: 05/18/2014 Elsevier Interactive Patient Education  2018 [place] (Cholelithiasis) La colelitiasis (tambin llamada clculos en la vescula) es una enfermedad de la vescula. La vescula es un rgano pequeo que ayuda a digerir las [place]. Los sntomas de clculos en la vescula son:  Feliberto Harts de vomitar (nuseas).  Devolver la comida (vomitar).  Dolor en el vientre.  Piel amarilla (ictericia)  Dolor sbito. Podr sentir Conservation officer, historic buildings durante algunos minutos o unas horas.  Cristy Hilts.  Dolor a Secretary/administrator. CUIDADOS EN EL HOGAR  Tome slo los medicamentos segn le haya indicado el mdico.  Siga una dieta baja en grasas hasta que vuelva a ver al mdico nuevamente. Consumir grasas puede Engineer, drilling.  Concurra a las consultas de control con el mdico, segn las indicaciones. Los ataques generalmente ocurren repetidas veces. Generalmente se requiere de [employer].  SOLICITE AYUDA DE  INMEDIATO SI:  El dolor empeora.  El dolor no se alivia con los [employer].  Tiene fiebre o sntomas que persisten durante ms de 2-3 das.  Tiene fiebre y los sntomas empeoran repentinamente.  Siente nuseas y vomita.  ASEGRESE DE QUE:  Comprende estas instrucciones.  Controlar su afeccin.  Recibir ayuda de inmediato si no mejora o si empeora.  Esta informacin no tiene Marine scientist el consejo del mdico. Asegrese de hacerle al mdico cualquier pregunta que tenga. Document Released: 04/21/2008 Document Revised: 09/25/2012 Document Reviewed: 07/17/2012 Elsevier Interactive Patient Education  2017 [employer].

## 2017-01-01 NOTE — Progress Notes (Signed)
Subjective:  Patient ID: Patricia Mitchell, female    DOB: 10-Nov-1960  Age: 56 y.o. MRN: [id number]  CC: gallstones  HPI Patricia Mitchell is a 56 y.o. female with a medical history of endometriosis and hyperlipidemia presents as a new patient to f/u on cholelithiasis. Had an US done at [facility] on 12/01/16 that revealed cholelithiasis without cholecystitis and revealed fatty liver. Patient asks if surgery is recommended. Used to take gallstone dissolution agent from [place] but has not done so for a few years. She is currently asymptomatic.     Had vertigo upon awakening for two out of the last three days. No vertigo this morning. Does not feel ill, denies headache, no tinnitus, and no loss of audition. No other complaints or symptoms.      Outpatient Medications Prior to Visit  Medication Sig Dispense Refill  . Calcium Carb-Cholecalciferol (CALCIUM + D3 PO) Take by mouth daily.    . Cholecalciferol (VITAMIN D PO) Take by mouth daily.    . Misc Natural Products (JOINT SUPPORT PO) Take 2 capsules by mouth daily.    . Multiple Vitamin (MULTIVITAMIN) tablet Take 1 tablet by mouth daily.    Marland Kitchen oxymetazoline (AFRIN NASAL SPRAY) 0.05 % nasal spray Place 1 spray into both nostrils 2 (two) times daily. Spray once into each nostril twice daily for up to the next 3 days. Do not use for more than 3 days to prevent rebound rhinorrhea. (Patient not taking: Reported on 01/01/2017) 30 mL 0  . RANITIDINE HCL PO Take by mouth daily.     No facility-administered medications prior to visit.      ROS Review of Systems  Constitutional: Negative for chills, fever and malaise/fatigue.  Eyes: Negative for blurred vision.  Respiratory: Negative for shortness of breath.   Cardiovascular: Negative for chest pain and palpitations.  Gastrointestinal: Negative for abdominal pain and nausea.  Genitourinary: Negative for dysuria and hematuria.  Musculoskeletal: Negative for joint pain and myalgias.   Skin: Negative for rash.  Neurological: Negative for tingling and headaches.  Psychiatric/Behavioral: Negative for depression. The patient is not nervous/anxious.     Objective:  BP 134/74 (BP Location: Left Arm, Patient Position: Sitting, Cuff Size: Normal)   Pulse 79   Temp 97.7 F (36.5 C) (Oral)   Wt 155 lb 12.8 oz (70.7 kg)   SpO2 96%   BMI 25.93 kg/m   BP/Weight 01/01/2017 3/47/4259 06/11/3873  Systolic BP 643 329 87  Diastolic BP 74 65 46  Wt. (Lbs) 155.8 - 154  BMI 25.93 - 25.63      Physical Exam  Constitutional: She is oriented to person, place, and time.  Well developed, well nourished, NAD, polite  HENT:  Head: Normocephalic and atraumatic.  TMs normal  Eyes: No scleral icterus.  Neck: Normal range of motion. Neck supple. No thyromegaly present.  Cardiovascular: Normal rate, regular rhythm and normal heart sounds.  Pulmonary/Chest: Effort normal and breath sounds normal.  Abdominal: Soft. Bowel sounds are normal. There is no tenderness.  Musculoskeletal: She exhibits no edema.  Neurological: She is alert and oriented to person, place, and time. No cranial nerve deficit. Coordination normal.  Marye Round test negative bilaterally  Skin: Skin is warm and dry. No rash noted. No erythema. No pallor.  Psychiatric: She has a normal mood and affect. Her behavior is normal. Thought content normal.  Vitals reviewed.    Assessment & Plan:   1. Calculus of gallbladder without cholecystitis without obstruction - ursodiol (  ACTIGALL) 250 MG tablet; Take 1 tablet (250 mg total) by mouth 2 (two) times daily as needed.  Dispense: 60 tablet; Refill: 11 - naproxen (NAPROSYN) 500 MG tablet; Take 1 tablet (500 mg total) by mouth 2 (two) times daily with a meal.  Dispense: 30 tablet; Refill: 0  2. Benign paroxysmal positional vertigo, unspecified laterality - Educated patient on BPPV, Dix Hallpike negative in clinic. Advised to call if vertigo persists.   Meds ordered  this encounter  Medications  . ursodiol (ACTIGALL) 250 MG tablet    Sig: Take 1 tablet (250 mg total) by mouth 2 (two) times daily as needed.    Dispense:  60 tablet    Refill:  11    Order Specific Question:   Supervising Provider    Answer:   Tresa Garter [id number]  . naproxen (NAPROSYN) 500 MG tablet    Sig: Take 1 tablet (500 mg total) by mouth 2 (two) times daily with a meal.    Dispense:  30 tablet    Refill:  0    Order Specific Question:   Supervising Provider    Answer:   Tresa Garter [[id number]]    Follow-up: Return in about 4 weeks (around 01/29/2017) for full physical.   Clent Demark PA

## 2017-02-22 ENCOUNTER — Encounter: Payer: Self-pay | Admitting: Physician Assistant

## 2017-06-04 ENCOUNTER — Other Ambulatory Visit (HOSPITAL_COMMUNITY)
Admission: RE | Admit: 2017-06-04 | Discharge: 2017-06-04 | Disposition: A | Discharge status: Home / Self Care | Payer: BLUE CROSS/BLUE SHIELD | Source: Ambulatory Visit | Attending: Physician Assistant | Admitting: Physician Assistant

## 2017-06-04 ENCOUNTER — Ambulatory Visit (INDEPENDENT_AMBULATORY_CARE_PROVIDER_SITE_OTHER): Payer: BLUE CROSS/BLUE SHIELD | Admitting: Physician Assistant

## 2017-06-04 ENCOUNTER — Other Ambulatory Visit: Payer: Self-pay

## 2017-06-04 ENCOUNTER — Encounter (INDEPENDENT_AMBULATORY_CARE_PROVIDER_SITE_OTHER): Payer: Self-pay | Admitting: Physician Assistant

## 2017-06-04 VITALS — BP 113/72 | HR 75 | Temp 98.3°F | Ht 65.0 in | Wt 159.0 lb

## 2017-06-04 DIAGNOSIS — Z1159 Encounter for screening for other viral diseases: Secondary | ICD-10-CM | POA: Diagnosis not present

## 2017-06-04 DIAGNOSIS — Z0000 Encounter for general adult medical examination without abnormal findings: Secondary | ICD-10-CM | POA: Diagnosis not present

## 2017-06-04 DIAGNOSIS — Z113 Encounter for screening for infections with a predominantly sexual mode of transmission: Secondary | ICD-10-CM | POA: Insufficient documentation

## 2017-06-04 DIAGNOSIS — Z114 Encounter for screening for human immunodeficiency virus [HIV]: Secondary | ICD-10-CM

## 2017-06-04 DIAGNOSIS — Z124 Encounter for screening for malignant neoplasm of cervix: Secondary | ICD-10-CM | POA: Insufficient documentation

## 2017-06-04 DIAGNOSIS — M8580 Other specified disorders of bone density and structure, unspecified site: Secondary | ICD-10-CM

## 2017-06-04 DIAGNOSIS — L821 Other seborrheic keratosis: Secondary | ICD-10-CM

## 2017-06-04 MED ORDER — ALENDRONATE SODIUM 35 MG PO TABS: 35.0000 mg | ORAL_TABLET | ORAL | 3 refills | Status: DC

## 2017-06-04 NOTE — Patient Instructions (Signed)
Queratosis seborreica  Seborrheic Keratosis  La queratosis seborreica es un trastorno frecuente y no canceroso (benigno) que se caracteriza por la presencia de tumores cutneos. Esta afeccin hace que aparezcan manchas cerosas, speras, marrones o negras en la piel. Estos tumores cutneos pueden ser planos o tener relieve.  Cules son las causas?  Se desconoce la causa de esta afeccin.  Qu incrementa el riesgo?  Es ms probable que esta afeccin se manifieste en:   Las personas que tienen antecedentes familiares de queratosis seborreica.   Las mayores de 50aos.   Las embarazadas.   Las personas que han recibido un tratamiento de reposicin con estrgenos.    Cules son los signos o los sntomas?  Esta afeccin a menudo se presenta en la cara, el pecho, los hombros, la espalda u otras reas. Estos tumores presentan las siguientes caractersticas:   Por lo general, son indoloros, pero pueden irritarse y picar.   Pueden ser de color amarillo, marrn, negro, o de otros colores.   Tienen un poco de relieve o son planos.   A veces, son speros o tienen una textura similar a la de una verruga.   A menudo, tienen un aspecto seroso en la superficie.   Son redondos u ovalados.   A veces, se ven como si estuviesen "pegados".   Suelen aparecer en grupos, pero pueden crecer de forma individual.    Cmo se diagnostica?  Esta afeccin se diagnostica mediante sus antecedentes mdicos y un examen fsico. Puede analizarse una muestra de un tumor (biopsia de piel). Tal vez haya que consultar a un especialista en piel (dermatlogo).  Cmo se trata?  Por lo general, esta afeccin no requiere tratamiento, excepto si los tumores se irritan o sangran con frecuencia. Si no le gusta el aspecto de estos tumores, puede solicitarle al mdico que se los extirpe. Con frecuencia, estos tumores se tratan mediante un procedimiento en el que se aplica nitrgeno lquido para "congelar" el tumor (criociruga). Tambin  pueden quemarse con electricidad o extirparse.  Siga estas instrucciones en su casa:   Controle los tumores para ver si hay cambios.   Concurra a todas las visitas de control como se lo haya indicado el mdico. Esto es importante.   No se rasque ni toquetee el tumor o los tumores. Esto puede hacer que se irriten o infecten.  Comunquese con un mdico si:   Le aparecen muchos tumores nuevos de forma repentina.   El tumor sangra, le pica o le duele.   El tumor se agranda o cambia de color de forma repentina.  Esta informacin no tiene como fin reemplazar el consejo del mdico. Asegrese de hacerle al mdico cualquier pregunta que tenga.  Document Released: 10/05/2010 Document Revised: 04/18/2016 Document Reviewed: 06/10/2014  Elsevier Interactive Patient Education  2018 Elsevier Inc.

## 2017-06-04 NOTE — Progress Notes (Signed)
Subjective:  Patient ID: Patricia Mitchell, female    DOB: 04/24/1960  Age: 57 y.o. MRN: [id number]  CC: annual physical  HPI Patricia Mitchell is a 57 y.o. female with a medical history of endometriosis and hyperlipidemia presents for an annual physical. Says she had a partial hysterectomy and did not have cervix removed. Denies any vaginal bleeding or discharge. No pelvic pain. Complains of two skin lesion on the right flank that concern her. Lesions have seemed to grow over time. Also brings copies of her bone density studies from two years ago. She has a T score of -2.1 at the worst. Used to take alendronate weekly but has run out. Does not endorse any other complaints or symptoms.     Outpatient Medications Prior to Visit  Medication Sig Dispense Refill  . Calcium Carb-Cholecalciferol (CALCIUM + D3 PO) Take by mouth daily.    . Cholecalciferol (VITAMIN D PO) Take by mouth daily.    . Misc Natural Products (JOINT SUPPORT PO) Take 2 capsules by mouth daily.    . Multiple Vitamin (MULTIVITAMIN) tablet Take 1 tablet by mouth daily.    Marland Kitchen RANITIDINE HCL PO Take by mouth daily.    . naproxen (NAPROSYN) 500 MG tablet Take 1 tablet (500 mg total) by mouth 2 (two) times daily with a meal. 30 tablet 0  . oxymetazoline (AFRIN NASAL SPRAY) 0.05 % nasal spray Place 1 spray into both nostrils 2 (two) times daily. Spray once into each nostril twice daily for up to the next 3 days. Do not use for more than 3 days to prevent rebound rhinorrhea. (Patient not taking: Reported on 01/01/2017) 30 mL 0  . ursodiol (ACTIGALL) 250 MG tablet Take 1 tablet (250 mg total) by mouth 2 (two) times daily as needed. 60 tablet 11   No facility-administered medications prior to visit.      ROS Review of Systems  Constitutional: Negative for chills, fever and malaise/fatigue.  Eyes: Negative for blurred vision.  Respiratory: Negative for shortness of breath.   Cardiovascular: Negative for chest pain and palpitations.   Gastrointestinal: Negative for abdominal pain and nausea.  Genitourinary: Negative for dysuria and hematuria.  Musculoskeletal: Negative for joint pain and myalgias.  Skin: Negative for rash.       Few skin lesions  Neurological: Negative for tingling and headaches.  Psychiatric/Behavioral: Negative for depression. The patient is not nervous/anxious.     Objective:  BP 113/72 (BP Location: Left Arm, Patient Position: Sitting, Cuff Size: Normal)   Pulse 75   Temp 98.3 F (36.8 C) (Oral)   Ht 5\' 5"  (1.651 m)   Wt 159 lb (72.1 kg)   SpO2 97%   BMI 26.46 kg/m   BP/Weight 06/04/2017 01/01/2017 4/66/5993  Systolic BP 570 177 939  Diastolic BP 72 74 65  Wt. (Lbs) 159 155.8 -  BMI 26.46 25.93 -      Physical Exam  Constitutional: She is oriented to person, place, and time.  Well developed, well nourished, NAD, polite  HENT:  Head: Normocephalic and atraumatic.  Mouth/Throat: No oropharyngeal exudate.  Eyes: Pupils are equal, round, and reactive to light. Conjunctivae and EOM are normal. No scleral icterus.  Neck: Normal range of motion. Neck supple. No thyromegaly present.  Cardiovascular: Normal rate, regular rhythm and normal heart sounds.  No LE edema bilaterally  Pulmonary/Chest: Effort normal and breath sounds normal. No respiratory distress.  Abdominal: Soft. Bowel sounds are normal. She exhibits no distension and no mass. There is  no tenderness.  Musculoskeletal: She exhibits no edema.  Back with full aROM and without elicitation of pain  Lymphadenopathy:    She has no cervical adenopathy.  Neurological: She is alert and oriented to person, place, and time. No cranial nerve deficit.  Patellar DTR 2+ bilaterally. Strength 5/5 throughout.   Skin: Skin is warm and dry. No rash noted. No erythema. No pallor.  Psychiatric: She has a normal mood and affect. Her behavior is normal. Thought content normal.  Vitals reviewed.    Assessment & Plan:   1. Annual physical  exam - CBC with Differential - Comprehensive metabolic panel - TSH - Lipid panel  2. Screening for cervical cancer - Cytology - PAP([place])  3. Need for hepatitis C screening test - Hepatitis c antibody (reflex)  4. Screening for HIV (human immunodeficiency virus) - HIV antibody  5. Seborrheic keratosis - Ambulatory referral to Dermatology  6. Osteopenia, unspecified location - Begin alendronate (FOSAMAX) 35 MG tablet; Take 1 tablet (35 mg total) by mouth every 7 (seven) days. Take with a full glass of water on an empty stomach.  Dispense: 12 tablet; Refill: 3 - DG Bone Density; Future   Meds ordered this encounter  Medications  . alendronate (FOSAMAX) 35 MG tablet    Sig: Take 1 tablet (35 mg total) by mouth every 7 (seven) days. Take with a full glass of water on an empty stomach.    Dispense:  12 tablet    Refill:  3    Order Specific Question:   Supervising Provider    Answer:   Tresa Garter [id number]    Follow-up: Return in about 6 months (around 12/04/2017) for lipid panel.   Clent Demark PA

## 2017-06-05 ENCOUNTER — Other Ambulatory Visit (INDEPENDENT_AMBULATORY_CARE_PROVIDER_SITE_OTHER): Payer: Self-pay | Admitting: Physician Assistant

## 2017-06-05 LAB — COMPREHENSIVE METABOLIC PANEL
ALT: 25 IU/L (ref 0–32)
AST: 16 IU/L (ref 0–40)
Albumin/Globulin Ratio: 2 (ref 1.2–2.2)
Albumin: 4.4 g/dL (ref 3.5–5.5)
Alkaline Phosphatase: 90 IU/L (ref 39–117)
BUN/Creatinine Ratio: 18 (ref 9–23)
BUN: 13 mg/dL (ref 6–24)
Bilirubin Total: <0.2 mg/dL (ref 0.0–1.2)
CALCIUM: 9 mg/dL (ref 8.7–10.2)
CO2: 22 mmol/L (ref 20–29)
Chloride: 106 mmol/L (ref 96–106)
Creatinine, Ser: 0.71 mg/dL (ref 0.57–1.00)
GFR calc Af Amer: 109 mL/min/{1.73_m2} (ref >59)
GFR, EST NON AFRICAN AMERICAN: 95 mL/min/{1.73_m2} (ref >59)
GLUCOSE: 97 mg/dL (ref 65–99)
Globulin, Total: 2.2 g/dL (ref 1.5–4.5)
POTASSIUM: 4.1 mmol/L (ref 3.5–5.2)
Sodium: 139 mmol/L (ref 134–144)
TOTAL PROTEIN: 6.6 g/dL (ref 6.0–8.5)

## 2017-06-05 LAB — TSH: TSH: 1.82 u[IU]/mL (ref 0.450–4.500)

## 2017-06-05 LAB — CBC WITH DIFFERENTIAL/PLATELET
Basophils Absolute: 0 10*3/uL (ref 0.0–0.2)
Basos: 0 %
EOS (ABSOLUTE): 0.1 10*3/uL (ref 0.0–0.4)
Eos: 2 %
Hematocrit: 38.6 % (ref 34.0–46.6)
Hemoglobin: 13.4 g/dL (ref 11.1–15.9)
IMMATURE GRANS (ABS): 0 10*3/uL (ref 0.0–0.1)
IMMATURE GRANULOCYTES: 0 %
Lymphocytes Absolute: 1.8 10*3/uL (ref 0.7–3.1)
Lymphs: 29 %
MCH: 32.1 pg (ref 26.6–33.0)
MCHC: 34.7 g/dL (ref 31.5–35.7)
MCV: 93 fL (ref 79–97)
MONOS ABS: 0.4 10*3/uL (ref 0.1–0.9)
Monocytes: 6 %
NEUTROS PCT: 63 %
Neutrophils Absolute: 3.9 10*3/uL (ref 1.4–7.0)
Platelets: 257 10*3/uL (ref 150–379)
RBC: 4.17 x10E6/uL (ref 3.77–5.28)
RDW: 13 % (ref 12.3–15.4)
WBC: 6.1 10*3/uL (ref 3.4–10.8)

## 2017-06-05 LAB — CYTOLOGY - PAP
BACTERIAL VAGINITIS: NEGATIVE
Candida vaginitis: NEGATIVE
Chlamydia: NEGATIVE
Diagnosis: NEGATIVE
NEISSERIA GONORRHEA: NEGATIVE
TRICH (WINDOWPATH): NEGATIVE

## 2017-06-05 LAB — LIPID PANEL
Chol/HDL Ratio: 4.7 ratio — ABNORMAL HIGH (ref 0.0–4.4)
Cholesterol, Total: 207 mg/dL — ABNORMAL HIGH (ref 100–199)
HDL: 44 mg/dL (ref >39)
TRIGLYCERIDES: 427 mg/dL — AB (ref 0–149)

## 2017-06-05 LAB — HIV ANTIBODY (ROUTINE TESTING W REFLEX): HIV Screen 4th Generation wRfx: NONREACTIVE

## 2017-06-05 LAB — HCV COMMENT:

## 2017-06-05 LAB — HEPATITIS C ANTIBODY (REFLEX)

## 2017-06-05 MED ORDER — FENOFIBRATE 145 MG PO TABS: 145.0000 mg | ORAL_TABLET | Freq: Every day | ORAL | 5 refills | Status: DC

## 2017-06-06 ENCOUNTER — Telehealth (INDEPENDENT_AMBULATORY_CARE_PROVIDER_SITE_OTHER): Payer: Self-pay

## 2017-06-06 NOTE — Telephone Encounter (Signed)
Using [phone]) with pacific interpreters patient has voicemail that is not set up yet. Will try calling back. Nat Christen, CMA

## 2017-06-06 NOTE — Telephone Encounter (Signed)
-----   Message from Clent Demark, PA-C sent at 06/05/2017 12:21 PM EDT ----- Triglycerides are highly elevated. I have sent fenofibrate to [facility].

## 2017-06-08 ENCOUNTER — Encounter (INDEPENDENT_AMBULATORY_CARE_PROVIDER_SITE_OTHER): Payer: Self-pay

## 2017-06-08 ENCOUNTER — Telehealth (INDEPENDENT_AMBULATORY_CARE_PROVIDER_SITE_OTHER): Payer: Self-pay

## 2017-06-08 NOTE — Telephone Encounter (Signed)
-----   Message from Clent Demark, PA-C sent at 06/05/2017 12:21 PM EDT ----- Triglycerides are highly elevated. I have sent fenofibrate to [facility].

## 2017-06-08 NOTE — Telephone Encounter (Signed)
Patients voicemail not set up yet. Mailed results. Patricia Mitchell, [place] interpreter [phone])

## 2017-07-17 ENCOUNTER — Ambulatory Visit
Admission: RE | Admit: 2017-07-17 | Discharge: 2017-07-17 | Disposition: A | Discharge status: Home / Self Care | Payer: BLUE CROSS/BLUE SHIELD | Source: Ambulatory Visit | Attending: Physician Assistant | Admitting: Physician Assistant

## 2017-07-17 DIAGNOSIS — M8580 Other specified disorders of bone density and structure, unspecified site: Secondary | ICD-10-CM

## 2017-08-29 ENCOUNTER — Ambulatory Visit
Admission: RE | Admit: 2017-08-29 | Discharge: 2017-08-29 | Disposition: A | Discharge status: Home / Self Care | Payer: BLUE CROSS/BLUE SHIELD | Source: Ambulatory Visit | Attending: Physician Assistant | Admitting: Physician Assistant

## 2017-08-29 ENCOUNTER — Other Ambulatory Visit (INDEPENDENT_AMBULATORY_CARE_PROVIDER_SITE_OTHER): Payer: Self-pay | Admitting: Physician Assistant

## 2017-08-31 ENCOUNTER — Telehealth (INDEPENDENT_AMBULATORY_CARE_PROVIDER_SITE_OTHER): Payer: Self-pay

## 2017-08-31 NOTE — Telephone Encounter (Signed)
-----   Message from Clent Demark, PA-C sent at 08/29/2017  5:56 PM EDT ----- Osteopenia. Take alendronate 35 mg as directed.

## 2017-08-31 NOTE — Telephone Encounter (Signed)
Call placed using pacific interpreter [phone]) patient is aware of osteopenia and to take alendronate 35 mg as directed. Patient expressed understanding. Nat Christen, CMA

## 2017-12-10 ENCOUNTER — Ambulatory Visit (INDEPENDENT_AMBULATORY_CARE_PROVIDER_SITE_OTHER): Payer: BLUE CROSS/BLUE SHIELD | Admitting: Physician Assistant

## 2017-12-10 ENCOUNTER — Encounter (INDEPENDENT_AMBULATORY_CARE_PROVIDER_SITE_OTHER): Payer: Self-pay | Admitting: Physician Assistant

## 2017-12-10 VITALS — BP 111/67 | HR 66 | Temp 97.8°F | Ht 65.0 in | Wt 160.4 lb

## 2017-12-10 DIAGNOSIS — T7840XA Allergy, unspecified, initial encounter: Secondary | ICD-10-CM

## 2017-12-10 DIAGNOSIS — E781 Pure hyperglyceridemia: Secondary | ICD-10-CM | POA: Diagnosis not present

## 2017-12-10 MED ORDER — CETIRIZINE HCL 10 MG PO TABS: 10.0000 mg | ORAL_TABLET | Freq: Every day | ORAL | 11 refills | Status: DC

## 2017-12-10 MED ORDER — FLUTICASONE PROPIONATE 50 MCG/ACT NA SUSP: 2.0000 | Freq: Every day | NASAL | 6 refills | Status: DC

## 2017-12-10 NOTE — Progress Notes (Signed)
Subjective:  Patient ID: Patricia Mitchell, female    DOB: [birth year]  Age: 57 y.o. MRN: [id number]  CC: f/u lipid panel  HPI Patricia Mitchell a 57 y.o.femalewith a medical history of endometriosis and hyperlipidemia presents to f/u on her hypertriglyceridemia. Last triglyceride level 427 mg/dL approximately six months ago. She has been taking fenofibrate 145 mg qday as directed. Denies epigastric pain, nausea, vomiting, fever, chills, constipation, diarrhea, abdominal bloating, LE edema,  CP, palpitations, SOB, HA, or GU sxs.     Only complaint is of chronic mucus production attributed to allergies. Has recently introduced a cat to her home. Is not taking antihistamines or using nasal sprays.       Outpatient Medications Prior to Visit  Medication Sig Dispense Refill  . Calcium Carb-Cholecalciferol (CALCIUM + D3 PO) Take by mouth daily.    . Cholecalciferol (VITAMIN D PO) Take by mouth daily.    . Misc Natural Products (JOINT SUPPORT PO) Take 2 capsules by mouth daily.    . Multiple Vitamin (MULTIVITAMIN) tablet Take 1 tablet by mouth daily.    Marland Kitchen alendronate (FOSAMAX) 35 MG tablet Take 1 tablet (35 mg total) by mouth every 7 (seven) days. Take with a full glass of water on an empty stomach. (Patient not taking: Reported on 12/10/2017) 12 tablet 3  . fenofibrate (TRICOR) 145 MG tablet Take 1 tablet (145 mg total) by mouth daily. (Patient not taking: Reported on 12/10/2017) 30 tablet 5  . RANITIDINE HCL PO Take by mouth daily.     No facility-administered medications prior to visit.      ROS Review of Systems  Constitutional: Negative for chills, fever and malaise/fatigue.  HENT: Positive for congestion.   Eyes: Negative for blurred vision.  Respiratory: Negative for shortness of breath.   Cardiovascular: Negative for chest pain and palpitations.  Gastrointestinal: Negative for abdominal pain and nausea.  Genitourinary: Negative for dysuria and hematuria.  Musculoskeletal: Negative for  joint pain and myalgias.  Skin: Negative for rash.  Neurological: Negative for tingling and headaches.  Psychiatric/Behavioral: Negative for depression. The patient is not nervous/anxious.     Objective:  BP 111/67 (BP Location: Left Arm, Patient Position: Sitting, Cuff Size: Normal)   Pulse 66   Temp 97.8 F (36.6 C) (Oral)   Ht 5\' 5"  (1.651 m)   Wt 160 lb 6.4 oz (72.8 kg)   SpO2 97%   BMI 26.69 kg/m   BP/Weight 12/10/2017 06/04/2017 54/27/0623  Systolic BP 762 831 517  Diastolic BP 67 72 74  Wt. (Lbs) 160.4 159 155.8  BMI 26.69 26.46 25.93      Physical Exam  Constitutional: She is oriented to person, place, and time.  Well developed, well nourished, NAD, polite  HENT:  Head: Normocephalic and atraumatic.  Mouth/Throat: No oropharyngeal exudate.  Turbinates mildly hypertrophic bilaterally  Eyes: No scleral icterus.  Neck: Normal range of motion. Neck supple. No thyromegaly present.  Cardiovascular: Normal rate, regular rhythm and normal heart sounds.  No LE edema bilaterally  Pulmonary/Chest: Effort normal and breath sounds normal.  Abdominal: Soft. She exhibits no distension. There is no tenderness.  Musculoskeletal: She exhibits no edema.  Lymphadenopathy:    She has no cervical adenopathy.  Neurological: She is alert and oriented to person, place, and time.  Skin: Skin is warm and dry. No rash noted. No erythema. No pallor.  Psychiatric: She has a normal mood and affect. Her behavior is normal. Thought content normal.  Vitals reviewed.    Assessment &  Plan:   1. Hypertriglyceridemia - Lipid panel; Future - Basic Metabolic Panel; Future  2. Allergic state, initial encounter - Begin cetirizine (ZYRTEC) 10 MG tablet; Take 1 tablet (10 mg total) by mouth daily.  Dispense: 30 tablet; Refill: 11 - Begin fluticasone (FLONASE) 50 MCG/ACT nasal spray; Place 2 sprays into both nostrils daily.  Dispense: 16 g; Refill: 6 - Pt foregoes allergen zone 2 testing due to  financial reasons.   Meds ordered this encounter  Medications  . cetirizine (ZYRTEC) 10 MG tablet    Sig: Take 1 tablet (10 mg total) by mouth daily.    Dispense:  30 tablet    Refill:  11    Order Specific Question:   Supervising Provider    Answer:   Charlott Rakes [[id number]]  . fluticasone (FLONASE) 50 MCG/ACT nasal spray    Sig: Place 2 sprays into both nostrils daily.    Dispense:  16 g    Refill:  6    Order Specific Question:   Supervising Provider    Answer:   Charlott Rakes [[id number]]    Follow-up: Return in about 6 months (around 06/10/2018) for hypertriglyceridemia.   Clent Demark PA

## 2017-12-10 NOTE — Patient Instructions (Signed)
Opciones de alimentos para Therapist, nutritional de triglicridos (Food Choices to Lower Your Triglycerides) Los triglicridos son un tipo de grasas que se Chief Strategy Officer. Un nivel elevado de triglicridos puede aumentar el riesgo de padecer enfermedades cardacas e infartos. Si sus niveles de triglicridos son altos, los alimentos que se ingieren y los hbitos de alimentacin son Theatre stage manager. Elegir los alimentos adecuados puede ayudar a Therapist, nutritional de triglicridos. [place]?  Baje de peso si es necesario.  Limite o evite el alcohol.  Llene la mitad del plato con vegetales y ensaladas de hojas verdes.  Terry a dos porciones por da. Elija frutas en lugar de jugos.  Ocupe un cuarto del plato con cereales integrales. Busque la palabra "integral" en Equities trader de la lista de ingredientes.  Llene un cuarto del plato con alimentos con protenas magras.  Disfrute de pescados grasos (como salmn, caballa, sardinas y atn) tres veces por semana.  Tracy grasas saludables.  Limite los alimentos con alto contenido de almidn y Location manager.  Consuma ms comida casera y menos de restaurante, de buf y comida rpida.  Limite el consumo de alimentos fritos.  Cocine los alimentos utilizando mtodos que no sean la fritura.  Limite el consumo de grasas saturadas.  Verifique las listas de ingredientes para evitar alimentos con aceites parcialmente hidrogenados (grasas trans).  QU ALIMENTOS PUEDO COMER? Cereales Cereales integrales, como los panes de salvado o [place], las [place], los cereales y las pastas. Avena sin endulzar, trigo, [place], quinua o arroz integral. Tortillas de harina de maz o de salvado. Vegetales Verduras frescas o congeladas (crudas, al vapor, asadas o grilladas). Ensaladas de hojas verdes. Fruits Frutas frescas, en conserva (en su jugo natural) o frutas congeladas. Carnes y otros productos con protenas Carne de  res molida (al 85% o ms [place]), carne de res de animales alimentados con pastos o carne de res sin la grasa. Pollo o pavo sin piel. Carne de pollo o de [place]. Cerdo sin la grasa. Todos los pescados y frutos de mar. Huevos. Porotos, guisantes o lentejas secos. Frutos secos o semillas sin sal. Frijoles secos o en lata sin sal. Lcteos Productos lcteos con bajo contenido de grasas, como [place] o al 1%, quesos reducidos en grasas o al 2%, ricota con bajo contenido de grasas o [employer], o yogur natural con bajo contenido de [place]. Grasas y Naval architect en barra que no contengan grasas trans. [place] y condimentos para ensaladas livianos o reducidos en grasas. [place]. Aceites de crtamo, oliva o canola. [place] natural de man o almendra. Los artculos mencionados arriba pueden no ser [employer] de las bebidas o los alimentos recomendados. Comunquese con el nutricionista para conocer ms opciones. QU ALIMENTOS NO SE RECOMIENDAN? Cereales Pan blanco. Pastas blancas. Arroz blanco. Pan de maz. Bagels, pasteles y croissants. Galletas saladas que contengan grasas trans. Vegetales Papas blancas. Maz. Vegetales con crema o fritos. Verduras en [place]. Fruits Frutas secas. Fruta enlatada en almbar liviano o espeso. Jugo de frutas. Carnes y otros productos con protenas Cort[place] de carne con Lobbyist. Costillas, alas de pollo, tocineta, salchicha, mortadela, salame, chinchulines, tocino, perros calientes, salchichas alemanas y embutidos envasados. Lcteos Leche entera o al 2%, crema, mezcla de [place] y crema y queso crema. Yogur entero o endulzado. Quesos con toda su grasa. [place]remas no lcteas y coberturas batidas. Quesos procesados, quesos para untar o cuajadas. Dulces y postres Jarabe de maz, azcares, miel  y Control and instrumentation engineer. Caramelos. Mermelada y [place]. Chrissie Noa. Cereales endulzados. Galletas, pasteles, bizcochuelos, donas, muffins y helado. Grasas y  aceites [place], [place] en barra, [place] de [place], [place], [place] clarificada o grasa de tocino. Aceites de coco, de palmiste o de palma. Bebidas Alcohol. Bebidas endulzadas (como refrescos, limonadas y bebidas frutales o ponches). Los artculos mencionados arriba pueden no ser [employer] de las bebidas y los alimentos que se Higher education careers adviser. Comunquese con el nutricionista para recibir ms informacin. Esta informacin no tiene Marine scientist el consejo del mdico. Asegrese de hacerle al mdico cualquier pregunta que tenga. Document Released: 07/13/2009 Document Revised: 01/28/2013 Document Reviewed: 11/27/2012 Elsevier Interactive Patient Education  2017 [employer].

## 2017-12-17 ENCOUNTER — Other Ambulatory Visit (INDEPENDENT_AMBULATORY_CARE_PROVIDER_SITE_OTHER): Payer: Self-pay | Admitting: Physician Assistant

## 2017-12-17 ENCOUNTER — Other Ambulatory Visit (INDEPENDENT_AMBULATORY_CARE_PROVIDER_SITE_OTHER): Payer: BLUE CROSS/BLUE SHIELD

## 2017-12-17 DIAGNOSIS — E781 Pure hyperglyceridemia: Secondary | ICD-10-CM

## 2017-12-17 NOTE — Progress Notes (Signed)
Labs collected by onsite phlebotomist. Nat Christen

## 2017-12-17 NOTE — Telephone Encounter (Signed)
90 day supply request. Nat Christen, CMA

## 2017-12-18 ENCOUNTER — Other Ambulatory Visit (INDEPENDENT_AMBULATORY_CARE_PROVIDER_SITE_OTHER): Payer: Self-pay | Admitting: Physician Assistant

## 2017-12-18 DIAGNOSIS — E7841 Elevated Lipoprotein(a): Secondary | ICD-10-CM

## 2017-12-18 LAB — BASIC METABOLIC PANEL
BUN/Creatinine Ratio: 14 (ref 9–23)
BUN: 14 mg/dL (ref 6–24)
CALCIUM: 9.4 mg/dL (ref 8.7–10.2)
CO2: 23 mmol/L (ref 20–29)
CREATININE: 1.01 mg/dL — AB (ref 0.57–1.00)
Chloride: 105 mmol/L (ref 96–106)
GFR calc Af Amer: 71 mL/min/{1.73_m2} (ref >59)
GFR calc non Af Amer: 62 mL/min/{1.73_m2} (ref >59)
GLUCOSE: 87 mg/dL (ref 65–99)
POTASSIUM: 4 mmol/L (ref 3.5–5.2)
SODIUM: 143 mmol/L (ref 134–144)

## 2017-12-18 LAB — LIPID PANEL
CHOLESTEROL TOTAL: 199 mg/dL (ref 100–199)
Chol/HDL Ratio: 3.9 ratio (ref 0.0–4.4)
HDL: 51 mg/dL (ref >39)
LDL CALC: 122 mg/dL — AB (ref 0–99)
Triglycerides: 132 mg/dL (ref 0–149)
VLDL Cholesterol Cal: 26 mg/dL (ref 5–40)

## 2017-12-18 MED ORDER — ATORVASTATIN CALCIUM 40 MG PO TABS: 40.0000 mg | ORAL_TABLET | Freq: Every day | ORAL | 3 refills | Status: DC

## 2017-12-19 ENCOUNTER — Telehealth (INDEPENDENT_AMBULATORY_CARE_PROVIDER_SITE_OTHER): Payer: Self-pay

## 2017-12-19 NOTE — Telephone Encounter (Signed)
-----   Message from Clent Demark, PA-C sent at 12/18/2017  8:46 AM EST ----- Pt's triglycerides are back to normal. LDL is elevated and I recommend she take atorvastatin 40 mg before bedtime. I have sent the prescription to [place] at [facility].

## 2017-12-19 NOTE — Telephone Encounter (Signed)
Call placed using pacific interpreter [phone]) left message asking patient to return call to [facility] at [phone]. Will attempt to call patient once more before mailing results. If patient returns call please notify her that her triglycerides are back to normal. LDL( bad cholesterol) is elevated. PCP recommends she take atorvastatin 40 mg before bedtime. Prescription for atorvastatin has been sent to [facility] on [address]. Nat Christen, CMA

## 2017-12-20 ENCOUNTER — Telehealth (INDEPENDENT_AMBULATORY_CARE_PROVIDER_SITE_OTHER): Payer: Self-pay

## 2017-12-20 NOTE — Telephone Encounter (Signed)
Call placed using pacific interpreter [phone]) patient is aware that triglycerides are back to normal but LDL is elevated. Atorvastatin 40 mg has been to [facility]. Take atorvastatin 40 mg daily before bedtime. Patient expressed understanding and did not have any additional questions. Nat Christen, CMA

## 2017-12-20 NOTE — Telephone Encounter (Signed)
-----   Message from Clent Demark, PA-C sent at 12/18/2017  8:46 AM EST ----- Pt's triglycerides are back to normal. LDL is elevated and I recommend she take atorvastatin 40 mg before bedtime. I have sent the prescription to [place] at [facility].

## 2018-04-22 ENCOUNTER — Telehealth (INDEPENDENT_AMBULATORY_CARE_PROVIDER_SITE_OTHER): Payer: Self-pay | Admitting: Physician Assistant

## 2018-04-22 NOTE — Telephone Encounter (Signed)
Patient called requesting a refill on alendronate (FOSAMAX) 35 MG tablet Patient was offered an appt for the refill but she stated to send a message first. Please f/u.

## 2018-04-23 ENCOUNTER — Encounter: Payer: Self-pay | Admitting: Internal Medicine

## 2018-04-24 ENCOUNTER — Other Ambulatory Visit (INDEPENDENT_AMBULATORY_CARE_PROVIDER_SITE_OTHER): Payer: Self-pay | Admitting: *Deleted

## 2018-04-24 DIAGNOSIS — M8580 Other specified disorders of bone density and structure, unspecified site: Secondary | ICD-10-CM

## 2018-04-24 MED ORDER — ALENDRONATE SODIUM 35 MG PO TABS: 35.0000 mg | ORAL_TABLET | ORAL | 3 refills | Status: DC

## 2018-06-21 ENCOUNTER — Encounter: Payer: Self-pay | Admitting: Internal Medicine

## 2018-07-19 ENCOUNTER — Encounter: Payer: BLUE CROSS/BLUE SHIELD | Admitting: Internal Medicine

## 2019-02-11 ENCOUNTER — Encounter (HOSPITAL_COMMUNITY): Payer: Self-pay | Admitting: Emergency Medicine

## 2019-02-11 ENCOUNTER — Ambulatory Visit (HOSPITAL_COMMUNITY)
Admission: EM | Admit: 2019-02-11 | Discharge: 2019-02-11 | Disposition: A | Discharge status: Home / Self Care | Payer: 59

## 2019-02-11 ENCOUNTER — Other Ambulatory Visit: Payer: Self-pay

## 2019-02-11 DIAGNOSIS — M546 Pain in thoracic spine: Secondary | ICD-10-CM | POA: Diagnosis not present

## 2019-02-11 MED ORDER — CYCLOBENZAPRINE HCL 10 MG PO TABS: 5.0000 mg | ORAL_TABLET | Freq: Every day | ORAL | 0 refills | Status: DC

## 2019-02-11 MED ORDER — NAPROXEN 500 MG PO TABS: 500.0000 mg | ORAL_TABLET | Freq: Two times a day (BID) | ORAL | 0 refills | Status: DC

## 2019-02-11 NOTE — ED Triage Notes (Addendum)
Back pain for 9 days.  Denies fall.  Upper back pain.  Radiating around epigastric area.  Raising body up from lying on back is painful.  No pain with ambulation or adl.  Pain is worse when leaning to the right rather than the left.  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  This pain started 1 hour after vacuuming.

## 2019-02-11 NOTE — ED Notes (Signed)
translator equipment currently in use

## 2019-02-11 NOTE — ED Provider Notes (Signed)
[place]    CSN: [id number] Arrival date & time: 02/11/19  1035      History   Chief Complaint No chief complaint on file.   HPI Patricia Mitchell is a 59 y.o. female.   Patient is otherwise healthy 59 year old female presents today with right upper back pain and chest pain.  Symptoms have been intermittent over the past 9 days.  Pain is worse with going from lying to sitting position.  Feels like a sharp pain or pulling.  Started after doing a a lot of vacuuming with a backpack vacuum.  She has been taking ibuprofen from [place] without much relief.  No chest pain or shortness of breath.  No cough, chest congestion or fevers.  No falls or injuries to the back.  No numbness, tingling or weakness.  ROS per HPI      Past Medical History:  Diagnosis Date  . Endometriosis of ovary   . Hyperlipidemia     Patient Active Problem List   Diagnosis Date Noted  . Adenomatous polyp 05/23/2013  . Osteopenia 10/19/2011  . 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 Hyperlipidemia 07/11/2011  . Constipation 07/11/2011  . Well woman exam 07/11/2011    Past Surgical History:  Procedure Laterality Date  . CYST REMOVAL HAND Bilateral 2010  . [place]   right taken out in 1995, left taken out in 1997 b/c of cysts  . VAGINAL HYSTERECTOMY  2000    OB History   No obstetric history on file.      Home Medications    Prior to Admission medications   Medication Sig Start Date End Date Taking? Authorizing Provider  atorvastatin (LIPITOR) 40 MG tablet Take 1 tablet (40 mg total) by mouth daily. 12/18/17  Yes Clent Demark, PA-C  Calcium Carb-Cholecalciferol (CALCIUM + D3 PO) Take by mouth daily.   Yes [provider]  Cholecalciferol (VITAMIN D PO) Take by mouth daily.   Yes [provider]  Multiple Vitamin (MULTIVITAMIN) tablet Take 1 tablet by mouth daily.   Yes [provider]  NON FORMULARY hormone   Yes [provider]  cyclobenzaprine (FLEXERIL) 10 MG tablet Take 0.5 tablets (5 mg total) by mouth at bedtime. 02/11/19   Orvan July, NP  Misc Natural Products (JOINT SUPPORT PO) Take 2 capsules by mouth daily.    [provider]  naproxen (NAPROSYN) 500 MG tablet Take 1 tablet (500 mg total) by mouth 2 (two) times daily. 02/11/19   Loura Halt A, NP  cetirizine (ZYRTEC) 10 MG tablet Take 1 tablet (10 mg total) by mouth daily. 12/10/17 02/11/19  Clent Demark, PA-C  fluticasone [facility]) 50 MCG/ACT nasal spray Place 2 sprays into both nostrils daily. 12/10/17 02/11/19  Clent Demark, PA-C    Family History Family History  Problem Relation Age of Onset  . Prostate cancer Father   . Cervical cancer Mother   . Colon cancer Neg Hx     Social History Social History   Tobacco Use  . Smoking status: Never Smoker  . Smokeless tobacco: Never Used  Substance Use Topics  . Alcohol use: No  . Drug use: No     Allergies   Patient has no known allergies.   Review of Systems Review of Systems   Physical Exam Triage Vital Signs ED Triage Vitals  Enc Vitals Group     BP 02/11/19 1136 122/71     Pulse Rate 02/11/19 1136 87  Resp 02/11/19 1136 18     Temp 02/11/19 1136 98.1 F (36.7 C)     Temp Source 02/11/19 1136 Oral     SpO2 02/11/19 1136 98 %     Weight --      Height --      Head Circumference --      Peak Flow --      Pain Score 02/11/19 1211 10     Pain Loc --      Pain Edu? --      Excl. in [place]? --    No data found.  Updated Vital Signs BP 122/71 (BP Location: Left Arm)   Pulse 87   Temp 98.1 F (36.7 C) (Oral)   Resp 18   SpO2 98%   Visual Acuity Right Eye Distance:   Left Eye Distance:   Bilateral Distance:    Right Eye Near:   Left Eye Near:    Bilateral Near:     Physical Exam Vitals and nursing note reviewed.  Constitutional:      General: She is not in acute distress.    Appearance: Normal appearance. She is not ill-appearing,  toxic-appearing or diaphoretic.  HENT:     Head: Normocephalic.     Nose: Nose normal.     Mouth/Throat:     Pharynx: Oropharynx is clear.  Eyes:     Conjunctiva/sclera: Conjunctivae normal.  Cardiovascular:     Rate and Rhythm: Normal rate and regular rhythm.  Pulmonary:     Effort: Pulmonary effort is normal.     Breath sounds: Normal breath sounds.  Abdominal:     Palpations: Abdomen is soft.     Tenderness: There is no abdominal tenderness.  Musculoskeletal:        General: Normal range of motion.     Cervical back: Normal range of motion.     Thoracic back: No swelling, edema, deformity, signs of trauma, lacerations, spasms, tenderness or bony tenderness. Normal range of motion. No scoliosis.       Back:     Comments: Non tender with palpation. Reporting pain in marked area with bending backward and forward.   Skin:    General: Skin is warm and dry.     Findings: No rash.  Neurological:     Mental Status: She is alert.  Psychiatric:        Mood and Affect: Mood normal.      UC Treatments / Results  Labs (all labs ordered are listed, but only abnormal results are displayed) Labs Reviewed - No data to display  EKG   Radiology No results found.  Procedures Procedures (including critical care time)  Medications Ordered in UC Medications - No data to display  Initial Impression / Assessment and Plan / UC Course  I have reviewed the triage vital signs and the nursing notes.  Pertinent labs & imaging results that were available during my care of the patient were reviewed by me and considered in my medical decision making (see chart for details).     Symptoms consistent with muscle strain/soreness.  Most likely from wearing the back pain vacuum.  We will try a low dose muscle relaxant to see if this helps.  Gentle stretching, heat and massage Naproxen for pain and inflammation.  Follow up as needed for continued or worsening symptoms  Final Clinical  Impressions(s) / UC Diagnoses   Final diagnoses:  Acute right-sided thoracic back pain     Discharge Instructions  Take the medication as prescribed.  Be aware the Flexeril may make you drowsy.  You can take half a tab to full tab at bedtime as needed Naproxen twice a day for pain and inflammation This is most likely a muscle strain Follow up as needed for continued or worsening symptoms     ED Prescriptions    Medication Sig Dispense Auth. Provider   cyclobenzaprine (FLEXERIL) 10 MG tablet Take 0.5 tablets (5 mg total) by mouth at bedtime. 20 tablet Jenene Kauffmann A, NP   naproxen (NAPROSYN) 500 MG tablet Take 1 tablet (500 mg total) by mouth 2 (two) times daily. 30 tablet Loura Halt A, NP     PDMP not reviewed this encounter.   Orvan July, NP 02/12/19 1007

## 2019-02-11 NOTE — Discharge Instructions (Addendum)
Take the medication as prescribed.  Be aware the Flexeril may make you drowsy.  You can take half a tab to full tab at bedtime as needed Naproxen twice a day for pain and inflammation This is most likely a muscle strain Follow up as needed for continued or worsening symptoms

## 2019-06-11 ENCOUNTER — Telehealth: Payer: Self-pay | Admitting: Family Medicine

## 2019-06-11 DIAGNOSIS — E781 Pure hyperglyceridemia: Secondary | ICD-10-CM

## 2019-06-11 DIAGNOSIS — Z1329 Encounter for screening for other suspected endocrine disorder: Secondary | ICD-10-CM

## 2019-06-11 DIAGNOSIS — Z1321 Encounter for screening for nutritional disorder: Secondary | ICD-10-CM

## 2019-06-11 DIAGNOSIS — E7841 Elevated Lipoprotein(a): Secondary | ICD-10-CM

## 2019-06-11 DIAGNOSIS — Z0000 Encounter for general adult medical examination without abnormal findings: Secondary | ICD-10-CM

## 2019-06-11 NOTE — Telephone Encounter (Signed)
Please order labs for physical comimg up 08/12/2019 pt is on nurses schedule for 08/08/2019 same day as her NP appt

## 2019-06-23 ENCOUNTER — Emergency Department (HOSPITAL_BASED_OUTPATIENT_CLINIC_OR_DEPARTMENT_OTHER)
Admission: EM | Admit: 2019-06-23 | Discharge: 2019-06-23 | Disposition: A | Discharge status: Home / Self Care | Payer: 59 | Attending: Emergency Medicine | Admitting: Emergency Medicine

## 2019-06-23 ENCOUNTER — Other Ambulatory Visit: Payer: Self-pay

## 2019-06-23 DIAGNOSIS — H0014 Chalazion left upper eyelid: Secondary | ICD-10-CM | POA: Insufficient documentation

## 2019-06-23 DIAGNOSIS — H019 Unspecified inflammation of eyelid: Secondary | ICD-10-CM | POA: Diagnosis present

## 2019-06-23 DIAGNOSIS — Z79899 Other long term (current) drug therapy: Secondary | ICD-10-CM | POA: Insufficient documentation

## 2019-06-23 MED ORDER — TETRACAINE HCL 0.5 % OP SOLN
2.0000 [drp] | Freq: Once | OPHTHALMIC | Status: AC
  Administered 2019-06-23: 2 [drp] via OPHTHALMIC
  Filled 2019-06-23: qty 4

## 2019-06-23 MED ORDER — ERYTHROMYCIN 5 MG/GM OP OINT
TOPICAL_OINTMENT | Freq: Once | OPHTHALMIC | Status: AC
  Filled 2019-06-23: qty 3.5

## 2019-06-23 MED ORDER — FLUORESCEIN SODIUM 1 MG OP STRP
1.0000 | ORAL_STRIP | Freq: Once | OPHTHALMIC | Status: AC
  Administered 2019-06-23: 1 via OPHTHALMIC
  Filled 2019-06-23: qty 1

## 2019-06-23 NOTE — ED Provider Notes (Signed)
[facility] EMERGENCY DEPARTMENT Provider Note   CSN: [id number] Arrival date & time: 06/23/19  [id number]     History Chief Complaint  Patient presents with  . Eye Problem    Patricia Mitchell is a 59 y.o. female.  Patient presents emergency department for left right upper eyelid redness and swelling.  This has been occurring for 4 days.  No injuries.  No vision changes.  No drainage or discharge.  No fevers, nausea or vomiting.  She has been applying chamomile tea bags, cold aloe vera to the area without any improvement.  No history of diabetes.        Past Medical History:  Diagnosis Date  . Endometriosis of ovary   . Hyperlipidemia     Patient Active Problem List   Diagnosis Date Noted  . Adenomatous polyp 05/23/2013  . Osteopenia 10/19/2011  . 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 Hyperlipidemia 07/11/2011  . Constipation 07/11/2011  . Well woman exam 07/11/2011    Past Surgical History:  Procedure Laterality Date  . CYST REMOVAL HAND Bilateral 2010  . [place]   right taken out in 1995, left taken out in 1997 b/c of cysts  . VAGINAL HYSTERECTOMY  2000     OB History   No obstetric history on file.     Family History  Problem Relation Age of Onset  . Prostate cancer Father   . Cervical cancer Mother   . Colon cancer Neg Hx     Social History   Tobacco Use  . Smoking status: Never Smoker  . Smokeless tobacco: Never Used  Substance Use Topics  . Alcohol use: No  . Drug use: No    Home Medications Prior to Admission medications   Medication Sig Start Date End Date Taking? Authorizing Provider  atorvastatin (LIPITOR) 40 MG tablet Take 1 tablet (40 mg total) by mouth daily. 12/18/17   Clent Demark, PA-C  Calcium Carb-Cholecalciferol (CALCIUM + D3 PO) Take by mouth daily.    [provider]  Cholecalciferol (VITAMIN D PO) Take by mouth daily.    [provider]  cyclobenzaprine (FLEXERIL) 10 MG  tablet Take 0.5 tablets (5 mg total) by mouth at bedtime. 02/11/19   Orvan July, NP  Misc Natural Products (JOINT SUPPORT PO) Take 2 capsules by mouth daily.    [provider]  Multiple Vitamin (MULTIVITAMIN) tablet Take 1 tablet by mouth daily.    [provider]  naproxen (NAPROSYN) 500 MG tablet Take 1 tablet (500 mg total) by mouth 2 (two) times daily. 02/11/19   Orvan July, NP  NON FORMULARY hormone    [provider]  cetirizine (ZYRTEC) 10 MG tablet Take 1 tablet (10 mg total) by mouth daily. 12/10/17 02/11/19  Clent Demark, PA-C  fluticasone [facility]) 50 MCG/ACT nasal spray Place 2 sprays into both nostrils daily. 12/10/17 02/11/19  Clent Demark, PA-C    Allergies    Patient has no known allergies.  Review of Systems   Review of Systems  Constitutional: Negative for fever.  Eyes: Positive for pain, redness and itching. Negative for photophobia, discharge and visual disturbance.  Gastrointestinal: Negative for nausea and vomiting.    Physical Exam Updated Vital Signs BP 134/74 (BP Location: Right Arm)   Pulse 60   Temp 98.3 F (36.8 C) (Oral)   Resp 18   Ht 5\' 5"  (1.651 m)   Wt 70.6 kg   SpO2 100%   BMI  25.91 kg/m   Physical Exam Vitals and nursing note reviewed.  Constitutional:      Appearance: She is well-developed.  HENT:     Head: Normocephalic and atraumatic.  Eyes:     General:        Right eye: No discharge or hordeolum.        Left eye: Hordeolum present.No discharge.     Conjunctiva/sclera:     Right eye: Right conjunctiva is not injected.     Left eye: Left conjunctiva is injected (Mild).      Comments: 1cm L lateral upper eyelid nodule.  Erythema confined to the left upper eyelid.  No surrounding cellulitis or signs of periorbital cellulitis.  Pulmonary:     Effort: No respiratory distress.  Musculoskeletal:     Cervical back: Normal range of motion and neck supple.  Skin:    General: Skin is warm and dry.    Neurological:     Mental Status: She is alert.     ED Results / Procedures / Treatments   Labs (all labs ordered are listed, but only abnormal results are displayed) Labs Reviewed - No data to display  EKG None  Radiology No results found.  Procedures Procedures (including critical care time)  Medications Ordered in ED Medications  fluorescein ophthalmic strip 1 strip (1 strip Left Eye Given 06/23/19 0912)  tetracaine (PONTOCAINE) 0.5 % ophthalmic solution 2 drop (2 drops Left Eye Given by Other 06/23/19 0912)  erythromycin ophthalmic ointment ( Left Eye Given 06/23/19 [id number])    ED Course  I have reviewed the triage vital signs and the nursing notes.  Pertinent labs & imaging results that were available during my care of the patient were reviewed by me and considered in my medical decision making (see chart for details).  Patient seen and examined. Work-up initiated. Medications ordered.   Vital signs reviewed and are as follows: BP 134/74 (BP Location: Right Arm)   Pulse 60   Temp 98.3 F (36.8 C) (Oral)   Resp 18   Ht 5\' 5"  (1.651 m)   Wt 70.6 kg   SpO2 100%   BMI 25.91 kg/m   Two drops of tetracaine/proparacaine instilled into affected eye.   Fluorescein strip applied to affected eye. Wood's lamp used to assess for corneal abrasion. No corneal abrasion identified. No foreign bodies noted. No visible hyphema.   Patient tolerated procedure well without immediate complication.   Patient will be treated for chalazion.  She will be started on erythromycin ointment.  Ophthalmology follow-up given and no improvement in 2 days.  Encouraged return to the emergency department for fever, increasing redness or swelling around the eye.  Spanish interpreter used during entire history and physical, questions answered.       MDM Rules/Calculators/A&P                      Patient with left upper eyelid chalazion, will continue with symptomatic management, orthopedic  follow-up as needed.   Final Clinical Impression(s) / ED Diagnoses Final diagnoses:  Chalazion of left upper eyelid    Rx / DC Orders ED Discharge Orders    None       Carlisle Cater, PA-C 06/23/19 [id number]    Veryl Speak, MD 06/23/19 1205

## 2019-06-23 NOTE — ED Triage Notes (Signed)
Pt reports swelling left eyelid for 4 days.  Denies vision changes

## 2019-06-23 NOTE — Discharge Instructions (Signed)
Please read and follow all provided instructions.  Your diagnoses today include:  1. Chalazion of left upper eyelid     Tests performed today include:  Visual acuity testing to check your vision  Fluorescein dye examination to look for scratches on your eye  Vital signs. See below for your results today.   Medications prescribed:   Erythromycin  - antibiotic eye ointment  Use this medication as follows:  Apply 1/4" of the antibiotic ointment to affected eye up to 6 times a day while awake for 7 days  Take any prescribed medications only as directed.  Home care instructions:  Follow any educational materials contained in this packet.  Follow-up care is necessary to be sure the infection is healing if not completely resolved in 2-3 days. See your caregiver or eye specialist as suggested for followup.   Please apply warm compresses to the eye for 15 minutes every 2-3 hours while awake.   Follow-up instructions: Please follow-up with the opthalmologist listed in the next 2-3 days for further evaluation of your symptoms if they are not getting better.   Return instructions:   Please return to the Emergency Department if you experience worsening symptoms.   Please return immediately if you develop severe pain, pus drainage, new change in vision, or fever.  Please return if you have any other emergent concerns.  Additional Information:  Your vital signs today were: BP 134/74 (BP Location: Right Arm)   Pulse 60   Temp 98.3 F (36.8 C) (Oral)   Resp 18   Ht 5\' 5"  (1.651 m)   Wt 70.6 kg   SpO2 100%   BMI 25.91 kg/m  If your blood pressure (BP) was elevated above 135/85 this visit, please have this repeated by your doctor within one month. ---------------

## 2019-07-03 ENCOUNTER — Other Ambulatory Visit: Payer: Self-pay

## 2019-07-03 ENCOUNTER — Ambulatory Visit (HOSPITAL_COMMUNITY)
Admission: EM | Admit: 2019-07-03 | Discharge: 2019-07-03 | Disposition: A | Discharge status: Home / Self Care | Payer: 59 | Attending: Family Medicine | Admitting: Family Medicine

## 2019-07-03 ENCOUNTER — Encounter (HOSPITAL_COMMUNITY): Payer: Self-pay | Admitting: Emergency Medicine

## 2019-07-03 DIAGNOSIS — E785 Hyperlipidemia, unspecified: Secondary | ICD-10-CM | POA: Diagnosis not present

## 2019-07-03 DIAGNOSIS — Z79899 Other long term (current) drug therapy: Secondary | ICD-10-CM | POA: Diagnosis not present

## 2019-07-03 DIAGNOSIS — J029 Acute pharyngitis, unspecified: Secondary | ICD-10-CM

## 2019-07-03 DIAGNOSIS — Z90721 Acquired absence of ovaries, unilateral: Secondary | ICD-10-CM | POA: Diagnosis not present

## 2019-07-03 DIAGNOSIS — M810 Age-related osteoporosis without current pathological fracture: Secondary | ICD-10-CM | POA: Insufficient documentation

## 2019-07-03 DIAGNOSIS — R0981 Nasal congestion: Secondary | ICD-10-CM | POA: Insufficient documentation

## 2019-07-03 DIAGNOSIS — J069 Acute upper respiratory infection, unspecified: Secondary | ICD-10-CM | POA: Insufficient documentation

## 2019-07-03 DIAGNOSIS — Z20822 Contact with and (suspected) exposure to covid-19: Secondary | ICD-10-CM | POA: Diagnosis not present

## 2019-07-03 DIAGNOSIS — Z791 Long term (current) use of non-steroidal anti-inflammatories (NSAID): Secondary | ICD-10-CM | POA: Diagnosis not present

## 2019-07-03 MED ORDER — ALENDRONATE SODIUM 35 MG PO TABS: 35.0000 mg | ORAL_TABLET | ORAL | 0 refills | Status: DC

## 2019-07-03 MED ORDER — PROMETHAZINE-DM 6.25-15 MG/5ML PO SYRP
5.0000 mL | ORAL_SOLUTION | Freq: Every evening | ORAL | 0 refills | Status: DC | PRN
Start: 2019-07-03 — End: 2019-08-08

## 2019-07-03 MED ORDER — BENZONATATE 100 MG PO CAPS: 100.0000 mg | ORAL_CAPSULE | Freq: Three times a day (TID) | ORAL | 0 refills | Status: DC | PRN

## 2019-07-03 MED ORDER — CETIRIZINE HCL 10 MG PO TABS: 10.0000 mg | ORAL_TABLET | Freq: Every day | ORAL | 0 refills | Status: DC

## 2019-07-03 NOTE — ED Provider Notes (Signed)
[facility]   MRN: [id number] DOB: [birth year]  Subjective:   Patricia Mitchell is a 59 y.o. female presenting for 4-day history persistent malaise.  ROS as below.  Patient has had both her Covid vaccinations.  Would also like a refill of her Fosamax.  No current facility-administered medications for this encounter.  Current Outpatient Medications:  .  atorvastatin (LIPITOR) 40 MG tablet, Take 1 tablet (40 mg total) by mouth daily., Disp: 90 tablet, Rfl: 3 .  Calcium Carb-Cholecalciferol (CALCIUM + D3 PO), Take by mouth daily., Disp: , Rfl:  .  Cholecalciferol (VITAMIN D PO), Take by mouth daily., Disp: , Rfl:  .  cyclobenzaprine (FLEXERIL) 10 MG tablet, Take 0.5 tablets (5 mg total) by mouth at bedtime., Disp: 20 tablet, Rfl: 0 .  Misc Natural Products (JOINT SUPPORT PO), Take 2 capsules by mouth daily., Disp: , Rfl:  .  Multiple Vitamin (MULTIVITAMIN) tablet, Take 1 tablet by mouth daily., Disp: , Rfl:  .  naproxen (NAPROSYN) 500 MG tablet, Take 1 tablet (500 mg total) by mouth 2 (two) times daily., Disp: 30 tablet, Rfl: 0 .  NON FORMULARY, hormone, Disp: , Rfl:    No Known Allergies  Past Medical History:  Diagnosis Date  . Endometriosis of ovary   . Hyperlipidemia      Past Surgical History:  Procedure Laterality Date  . CYST REMOVAL HAND Bilateral 2010  . [place]   right taken out in 1995, left taken out in 1997 b/c of cysts  . VAGINAL HYSTERECTOMY  2000    Family History  Problem Relation Age of Onset  . Prostate cancer Father   . Cervical cancer Mother   . Colon cancer Neg Hx     Social History   Tobacco Use  . Smoking status: Never Smoker  . Smokeless tobacco: Never Used  Substance Use Topics  . Alcohol use: No  . Drug use: No    Review of Systems  Constitutional: Positive for malaise/fatigue. Negative for fever.  HENT: Positive for congestion, ear pain (right sided) and sore throat. Negative for sinus pain.   Eyes: Negative for  blurred vision, double vision, discharge and redness.  Respiratory: Positive for cough (with chest congestion). Negative for hemoptysis, shortness of breath and wheezing.   Cardiovascular: Negative for chest pain (with cough only).  Gastrointestinal: Negative for abdominal pain, diarrhea, nausea and vomiting.  Genitourinary: Negative for dysuria, flank pain and hematuria.  Musculoskeletal: Negative for myalgias.  Skin: Negative for rash.  Neurological: Negative for dizziness, weakness and headaches.  Psychiatric/Behavioral: Negative for depression and substance abuse.     Objective:   Vitals: BP 106/87   Pulse 82   Temp 98.4 F (36.9 C) (Oral)   Resp 16   SpO2 97%   Physical Exam Constitutional:      General: She is not in acute distress.    Appearance: Normal appearance. She is well-developed. She is not ill-appearing, toxic-appearing or diaphoretic.  HENT:     Head: Normocephalic and atraumatic.     Right Ear: Tympanic membrane and ear canal normal. No drainage or tenderness. No middle ear effusion. Tympanic membrane is not erythematous.     Left Ear: Tympanic membrane and ear canal normal. No drainage or tenderness.  No middle ear effusion. Tympanic membrane is not erythematous.     Nose: Nose normal. No congestion or rhinorrhea.     Mouth/Throat:     Mouth: Mucous membranes are moist. No oral lesions.  Pharynx: Oropharynx is clear. No pharyngeal swelling, oropharyngeal exudate, posterior oropharyngeal erythema or uvula swelling.     Tonsils: No tonsillar exudate or tonsillar abscesses.  Eyes:     Extraocular Movements: Extraocular movements intact.     Right eye: Normal extraocular motion.     Left eye: Normal extraocular motion.     Conjunctiva/sclera: Conjunctivae normal.     Pupils: Pupils are equal, round, and reactive to light.  Cardiovascular:     Rate and Rhythm: Normal rate and regular rhythm.     Pulses: Normal pulses.     Heart sounds: Normal heart sounds.  No murmur. No friction rub. No gallop.   Pulmonary:     Effort: Pulmonary effort is normal. No respiratory distress.     Breath sounds: Normal breath sounds. No stridor. No wheezing, rhonchi or rales.  Musculoskeletal:     Cervical back: Normal range of motion and neck supple.  Lymphadenopathy:     Cervical: No cervical adenopathy.  Skin:    General: Skin is warm and dry.     Findings: No rash.  Neurological:     General: No focal deficit present.     Mental Status: She is alert and oriented to person, place, and time.  Psychiatric:        Mood and Affect: Mood normal.        Behavior: Behavior normal.        Thought Content: Thought content normal.      Assessment and Plan :   PDMP not reviewed this encounter.  1. Viral URI with cough   2. Sore throat   3. Nasal congestion   4. Osteoporosis, unspecified osteoporosis type, unspecified pathological fracture presence     Will manage for viral illness such as viral URI, viral syndrome, viral rhinitis, COVID-19 (although low suspicion given her COVID vaccines). Counseled patient on nature of COVID-19 including modes of transmission, diagnostic testing, management and supportive care.  Offered symptomatic relief. Refilled her Fosamax. COVID 19 testing is pending. Counseled patient on potential for adverse effects with medications prescribed/recommended today, ER and return-to-clinic precautions discussed, patient verbalized understanding.     Jaynee Eagles, [place] 07/03/19 [id number]

## 2019-07-03 NOTE — ED Triage Notes (Signed)
Symptoms started Monday.   Wants COVID testing and medicine for throat.    She has had both COVID vaccines.

## 2019-07-04 LAB — SARS CORONAVIRUS 2 (TAT 6-24 HRS): SARS Coronavirus 2: NEGATIVE

## 2019-08-08 ENCOUNTER — Ambulatory Visit (INDEPENDENT_AMBULATORY_CARE_PROVIDER_SITE_OTHER): Payer: 59 | Admitting: Family Medicine

## 2019-08-08 ENCOUNTER — Other Ambulatory Visit: Payer: Self-pay

## 2019-08-08 ENCOUNTER — Encounter: Payer: Self-pay | Admitting: Family Medicine

## 2019-08-08 VITALS — BP 116/65 | HR 76 | Temp 97.9°F | Ht 65.0 in | Wt 152.0 lb

## 2019-08-08 DIAGNOSIS — E8940 Asymptomatic postprocedural ovarian failure: Secondary | ICD-10-CM

## 2019-08-08 DIAGNOSIS — Z1231 Encounter for screening mammogram for malignant neoplasm of breast: Secondary | ICD-10-CM

## 2019-08-08 DIAGNOSIS — E781 Pure hyperglyceridemia: Secondary | ICD-10-CM

## 2019-08-08 DIAGNOSIS — E7841 Elevated Lipoprotein(a): Secondary | ICD-10-CM

## 2019-08-08 DIAGNOSIS — M8588 Other specified disorders of bone density and structure, other site: Secondary | ICD-10-CM

## 2019-08-08 DIAGNOSIS — Z1329 Encounter for screening for other suspected endocrine disorder: Secondary | ICD-10-CM

## 2019-08-08 DIAGNOSIS — Z0000 Encounter for general adult medical examination without abnormal findings: Secondary | ICD-10-CM

## 2019-08-08 DIAGNOSIS — Z1321 Encounter for screening for nutritional disorder: Secondary | ICD-10-CM

## 2019-08-08 DIAGNOSIS — E7800 Pure hypercholesterolemia, unspecified: Secondary | ICD-10-CM

## 2019-08-08 MED ORDER — ALENDRONATE SODIUM 35 MG PO TABS: 35.0000 mg | ORAL_TABLET | ORAL | 3 refills | Status: AC

## 2019-08-08 MED ORDER — PAROXETINE HCL 10 MG PO TABS: 10.0000 mg | ORAL_TABLET | Freq: Every day | ORAL | 3 refills | Status: DC

## 2019-08-08 NOTE — Patient Instructions (Signed)
° ° ° °  If you have lab work done today you will be contacted with your lab results within the next 2 weeks.  If you have not heard from us then please contact us. The fastest way to get your results is to register for My Chart. ° ° °IF you received an x-ray today, you will receive an invoice from [facility]. Please contact [facility] at [phone] with questions or concerns regarding your invoice.  ° °IF you received labwork today, you will receive an invoice from [facility]. Please contact [facility] at [phone] with questions or concerns regarding your invoice.  ° °Our billing staff will not be able to assist you with questions regarding bills from these companies. ° °You will be contacted with the lab results as soon as they are available. The fastest way to get your results is to activate your My Chart account. Instructions are located on the last page of this paperwork. If you have not heard from us regarding the results in 2 weeks, please contact this office. °  ° ° ° °

## 2019-08-08 NOTE — Progress Notes (Signed)
7/2/20213:11 PM  Patricia Mitchell 12-Apr-1960, 59 y.o., female [id number]  Chief Complaint  Patient presents with  . New Patient (Initial Visit)  . Medication Refill    paroxetine 10 mg   . Referral    hot flashes    HPI:   Patient is a 59 y.o. female with past medical history significant for osteopenia,h/o colonic polyps, HLP, postsurgical menopause  who presents today to establish care  Previous PCP at [facility], new insurance does not cover them anymore Chart review thru [facility] and care everywhere, 20 minutes  Vaginal partial hysterectomy and then BSO 2/2 endometriosis in her 92s Was on HRT for about 10 years  Currently on paxil 10mg  for hotflashes - works well for her She reports that her cervix was not removed completely Last pap April 2019 - normal  Due for mammogram Last mammo August 22 2018 birads 1 negative  Done at [facility]  Osteopenia, Dx 2013 2 years of Prolia in [place] Currently on fosamax since 2018 She is also taking vitamin D plus calcium Bone dexa: Last in 08/29/2017 , T score spine :-1.9   H/o colonic polyps Last colonoscopy done at [facility] nov 2020, normal Repeat in 7 years  H/o HLP used to take atorvastatin but has been wo medication for about a year  Depression screen [facility] 2/9 12/10/2017 06/04/2017 01/01/2017  Decreased Interest 0 0 0  Down, Depressed, Hopeless 0 0 0  PHQ - 2 Score 0 0 0    No flowsheet data found.   No Known Allergies  Prior to Admission medications   Medication Sig Start Date End Date Taking? Authorizing Provider  alendronate (FOSAMAX) 35 MG tablet Take 1 tablet (35 mg total) by mouth every 7 (seven) days. Take with a full glass of water on an empty stomach. 07/03/19 10/03/19 Yes Jaynee Eagles, PA-C  Calcium Carb-Cholecalciferol (CALCIUM + D3 PO) Take by mouth daily.   Yes [provider]  Misc Natural Products (JOINT SUPPORT PO) Take 2 capsules by mouth daily.   Yes [provider]  PARoxetine (PAXIL) 10 MG  tablet Take 10 mg by mouth daily. 05/09/19  Yes [provider]  vitamin B-12 (CYANOCOBALAMIN) 100 MCG tablet Take 100 mcg by mouth daily.   Yes [provider]  vitamin C (ASCORBIC ACID) 250 MG tablet Take 250 mg by mouth daily.   Yes [provider]  vitamin E (VITAMIN E) 200 UNIT capsule Take 200 Units by mouth daily.   Yes [provider]  atorvastatin (LIPITOR) 40 MG tablet Take 1 tablet (40 mg total) by mouth daily. Patient not taking: Reported on 08/08/2019 12/18/17   Clent Demark, PA-C  fluticasone [facility]) 50 MCG/ACT nasal spray Place 2 sprays into both nostrils daily. 12/10/17 02/11/19  Clent Demark, PA-C    Past Medical History:  Diagnosis Date  . Endometriosis of ovary   . Hyperlipidemia     Past Surgical History:  Procedure Laterality Date  . CYST REMOVAL HAND Bilateral 2010  . [place]   right taken out in 1995, left taken out in 1997 b/c of cysts  . VAGINAL HYSTERECTOMY  2000    Social History   Tobacco Use  . Smoking status: Never Smoker  . Smokeless tobacco: Never Used  Substance Use Topics  . Alcohol use: No    Family History  Problem Relation Age of Onset  . Prostate cancer Father   . Cervical cancer Mother   . Colon cancer  Neg Hx     Review of Systems  Constitutional: Negative for chills and fever.  Respiratory: Negative for cough and shortness of breath.   Cardiovascular: Negative for chest pain, palpitations and leg swelling.  Gastrointestinal: Negative for abdominal pain, nausea and vomiting.  per hpi   OBJECTIVE:  Today's Vitals   08/08/19 1450  BP: 116/65  Pulse: 76  Temp: 97.9 F (36.6 C)  SpO2: 96%  Weight: 152 lb (68.9 kg)  Height: 5\' 5"  (1.651 m)   Body mass index is 25.29 kg/m.   Physical Exam Vitals and nursing note reviewed.  Constitutional:      Appearance: She is well-developed.  HENT:     Head: Normocephalic and atraumatic.     Mouth/Throat:     Pharynx:  No oropharyngeal exudate.  Eyes:     General: No scleral icterus.    Conjunctiva/sclera: Conjunctivae normal.     Pupils: Pupils are equal, round, and reactive to light.  Cardiovascular:     Rate and Rhythm: Normal rate and regular rhythm.     Heart sounds: Normal heart sounds. No murmur heard.  No friction rub. No gallop.   Pulmonary:     Effort: Pulmonary effort is normal.     Breath sounds: Normal breath sounds. No wheezing or rales.  Musculoskeletal:     Cervical back: Neck supple.  Skin:    General: Skin is warm and dry.  Neurological:     Mental Status: She is alert and oriented to person, place, and time.     No results found for this or any previous visit (from the past 24 hour(s)).  No results found.   ASSESSMENT and PLAN  1. Premature surgical menopause Vasomotor symptoms well controlled with paxil Due for pap in 2022  2. Pure hypercholesterolemia Labs done today. Will restart medication as needed  3. Osteopenia of lumbar spine dexa for treatment monitoring. Cont current regime. - DG Bone Density; Future  4. Visit for screening mammogram - MM DIGITAL SCREENING BILATERAL; Future  Other orders - vitamin C (ASCORBIC ACID) 250 MG tablet; Take 250 mg by mouth daily. - vitamin B-12 (CYANOCOBALAMIN) 100 MCG tablet; Take 100 mcg by mouth daily. - vitamin E (VITAMIN E) 200 UNIT capsule; Take 200 Units by mouth daily. - PARoxetine (PAXIL) 10 MG tablet; Take 1 tablet (10 mg total) by mouth daily. - alendronate (FOSAMAX) 35 MG tablet; Take 1 tablet (35 mg total) by mouth every 7 (seven) days. Take with a full glass of water on an empty stomach.  No follow-ups on file.    Rutherford Guys, MD [facility] [place], [place] [address] Ph.  [phone] Fax [phone]

## 2019-08-09 LAB — CMP14+EGFR
ALT: 26 IU/L (ref 0–32)
AST: 19 IU/L (ref 0–40)
Albumin/Globulin Ratio: 1.9 (ref 1.2–2.2)
Albumin: 4.4 g/dL (ref 3.8–4.9)
Alkaline Phosphatase: 81 IU/L (ref 48–121)
BUN/Creatinine Ratio: 24 — ABNORMAL HIGH (ref 9–23)
BUN: 18 mg/dL (ref 6–24)
Bilirubin Total: 0.3 mg/dL (ref 0.0–1.2)
CO2: 23 mmol/L (ref 20–29)
Calcium: 9 mg/dL (ref 8.7–10.2)
Chloride: 104 mmol/L (ref 96–106)
Creatinine, Ser: 0.75 mg/dL (ref 0.57–1.00)
GFR calc Af Amer: 101 mL/min/{1.73_m2} (ref >59)
GFR calc non Af Amer: 88 mL/min/{1.73_m2} (ref >59)
Globulin, Total: 2.3 g/dL (ref 1.5–4.5)
Glucose: 102 mg/dL — ABNORMAL HIGH (ref 65–99)
Potassium: 4.2 mmol/L (ref 3.5–5.2)
Sodium: 141 mmol/L (ref 134–144)
Total Protein: 6.7 g/dL (ref 6.0–8.5)

## 2019-08-09 LAB — TSH: TSH: 2.24 u[IU]/mL (ref 0.450–4.500)

## 2019-08-09 LAB — LIPID PANEL
Chol/HDL Ratio: 4.3 ratio (ref 0.0–4.4)
Cholesterol, Total: 264 mg/dL — ABNORMAL HIGH (ref 100–199)
HDL: 61 mg/dL (ref >39)
LDL Chol Calc (NIH): 179 mg/dL — ABNORMAL HIGH (ref 0–99)
Triglycerides: 133 mg/dL (ref 0–149)
VLDL Cholesterol Cal: 24 mg/dL (ref 5–40)

## 2019-08-09 LAB — VITAMIN D 25 HYDROXY (VIT D DEFICIENCY, FRACTURES): Vit D, 25-Hydroxy: 37 ng/mL (ref 30.0–100.0)

## 2019-08-12 ENCOUNTER — Encounter: Payer: Self-pay | Admitting: Family Medicine

## 2019-09-03 ENCOUNTER — Other Ambulatory Visit: Payer: Self-pay | Admitting: Family Medicine

## 2019-09-03 DIAGNOSIS — E7841 Elevated Lipoprotein(a): Secondary | ICD-10-CM

## 2019-09-03 MED ORDER — ATORVASTATIN CALCIUM 40 MG PO TABS: 40.0000 mg | ORAL_TABLET | Freq: Every day | ORAL | 3 refills | Status: DC

## 2019-11-12 LAB — HM MAMMOGRAPHY

## 2020-03-16 ENCOUNTER — Ambulatory Visit (INDEPENDENT_AMBULATORY_CARE_PROVIDER_SITE_OTHER): Payer: 59 | Admitting: Emergency Medicine

## 2020-03-16 ENCOUNTER — Encounter: Payer: Self-pay | Admitting: Emergency Medicine

## 2020-03-16 ENCOUNTER — Other Ambulatory Visit: Payer: Self-pay

## 2020-03-16 VITALS — BP 143/80 | HR 73 | Temp 98.4°F | Resp 16 | Ht 65.0 in | Wt 158.0 lb

## 2020-03-16 DIAGNOSIS — E785 Hyperlipidemia, unspecified: Secondary | ICD-10-CM

## 2020-03-16 DIAGNOSIS — R109 Unspecified abdominal pain: Secondary | ICD-10-CM

## 2020-03-16 DIAGNOSIS — R10A0 Flank pain, unspecified side: Secondary | ICD-10-CM

## 2020-03-16 DIAGNOSIS — Z7689 Persons encountering health services in other specified circumstances: Secondary | ICD-10-CM | POA: Diagnosis not present

## 2020-03-16 DIAGNOSIS — E8940 Asymptomatic postprocedural ovarian failure: Secondary | ICD-10-CM

## 2020-03-16 DIAGNOSIS — Z13820 Encounter for screening for osteoporosis: Secondary | ICD-10-CM

## 2020-03-16 LAB — POCT URINALYSIS DIP (MANUAL ENTRY)
Bilirubin, UA: NEGATIVE
Glucose, UA: NEGATIVE mg/dL
Ketones, POC UA: NEGATIVE mg/dL
Leukocytes, UA: NEGATIVE
Nitrite, UA: NEGATIVE
Protein Ur, POC: NEGATIVE mg/dL
Spec Grav, UA: 1.02 (ref 1.010–1.025)
Urobilinogen, UA: 0.2 E.U./dL
pH, UA: 6 (ref 5.0–8.0)

## 2020-03-16 LAB — POC MICROSCOPIC URINALYSIS (UMFC): Mucus: ABSENT

## 2020-03-16 NOTE — Progress Notes (Signed)
Patricia Mitchell 60 y.o.   Chief Complaint  Patient presents with  . Establish Care  . Flank Pain    Per patient in the front and back for a couple of months    HISTORY OF PRESENT ILLNESS: This is a 60 y.o. female former patient of Dr. Pamella Pert.  Here to establish care with me. Complaining of intermittent short lived pains to left lower abdomen and flank for the past several months with no particular triggers although mostly when she bends down.  No associated symptoms. Has history of premature postsurgical menopause.  Needs GYN referral. No other complaints or medical complaints today.  HPI   Prior to Admission medications   Medication Sig Start Date End Date Taking? Authorizing Provider  atorvastatin (LIPITOR) 40 MG tablet Take 1 tablet (40 mg total) by mouth daily. 09/03/19   Daleen Squibb, MD  Calcium Carb-Cholecalciferol (CALCIUM + D3 PO) Take by mouth daily.    [provider]  Misc Natural Products (JOINT SUPPORT PO) Take 2 capsules by mouth daily.    [provider]  PARoxetine (PAXIL) 10 MG tablet Take 1 tablet (10 mg total) by mouth daily. 08/08/19   Daleen Squibb, MD  vitamin B-12 (CYANOCOBALAMIN) 100 MCG tablet Take 100 mcg by mouth daily.    [provider]  vitamin C (ASCORBIC ACID) 250 MG tablet Take 250 mg by mouth daily.    [provider]  vitamin E (VITAMIN E) 200 UNIT capsule Take 200 Units by mouth daily.    [provider]  fluticasone (FLONASE) 50 MCG/ACT nasal spray Place 2 sprays into both nostrils daily. 12/10/17 02/11/19  Clent Demark, PA-C    No Known Allergies  Patient Active Problem List   Diagnosis Date Noted  . Adenomatous polyp 05/23/2013  . Osteopenia 10/19/2011  . 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 Hyperlipidemia 07/11/2011  . Constipation 07/11/2011  . Well woman exam 07/11/2011    Past Medical History:  Diagnosis Date  . Endometriosis of ovary   . Hyperlipidemia      Past Surgical History:  Procedure Laterality Date  . CYST REMOVAL HAND Bilateral 2010  . [place]   right taken out in 1995, left taken out in 1997 b/c of cysts  . VAGINAL HYSTERECTOMY  2000    Social History   Socioeconomic History  . Marital status: Single    Spouse name: Not on file  . Number of children: Not on file  . Years of education: Not on file  . Highest education level: Not on file  Occupational History  . Not on file  Tobacco Use  . Smoking status: Never Smoker  . Smokeless tobacco: Never Used  Substance and Sexual Activity  . Alcohol use: No  . Drug use: No  . Sexual activity: Never    Birth control/protection: Surgical  Other Topics Concern  . Not on file  Social History Narrative   Spanish speaking   [name] about [place] from a family member   Lives at home with 46 yo brother      Last mammogram in 1996 (normal)   Last PAP in 2002 (normal)   Social Determinants of Health   Financial Resource Strain: Not on file  Food Insecurity: Not on file  Transportation Needs: Not on file  Physical Activity: Not on file  Stress: Not on file  Social Connections: Not on file  Intimate Partner Violence: Not on file    Family History  Problem Relation Age of Onset  . Prostate cancer Father   . Cervical cancer Mother   . Colon cancer Neg Hx      Review of Systems  Constitutional: Negative.  Negative for chills and fever.  HENT: Negative.  Negative for congestion and sore throat.   Respiratory: Negative.  Negative for cough and shortness of breath.   Cardiovascular: Negative.  Negative for chest pain and palpitations.  Gastrointestinal: Negative.  Negative for abdominal pain, diarrhea, nausea and vomiting.  Genitourinary: Positive for flank pain.  Musculoskeletal: Negative for back pain, myalgias and neck pain.  Skin: Negative.  Negative for rash.  Neurological: Negative for dizziness and headaches.  All other systems reviewed and are  negative.  Today's Vitals   03/16/20 1617  BP: (!) 143/80  Pulse: 73  Resp: 16  Temp: 98.4 F (36.9 C)  TempSrc: Temporal  SpO2: 98%  Weight: 158 lb (71.7 kg)  Height: 5\' 5"  (1.651 m)   Body mass index is 26.29 kg/m. Wt Readings from Last 3 Encounters:  03/16/20 158 lb (71.7 kg)  08/08/19 152 lb (68.9 kg)  06/23/19 155 lb 11.2 oz (70.6 kg)     Physical Exam Vitals reviewed.  Constitutional:      Appearance: Normal appearance.  HENT:     Head: Normocephalic.  Eyes:     Extraocular Movements: Extraocular movements intact.     Pupils: Pupils are equal, round, and reactive to light.  Cardiovascular:     Rate and Rhythm: Normal rate and regular rhythm.     Pulses: Normal pulses.     Heart sounds: Normal heart sounds.  Pulmonary:     Effort: Pulmonary effort is normal.     Breath sounds: Normal breath sounds.  Abdominal:     General: Bowel sounds are normal. There is no distension.     Palpations: Abdomen is soft.     Tenderness: There is no abdominal tenderness. There is no right CVA tenderness or left CVA tenderness.  Musculoskeletal:        General: Normal range of motion.     Cervical back: Normal range of motion and neck supple.  Skin:    General: Skin is warm and dry.  Neurological:     General: No focal deficit present.     Mental Status: She is alert and oriented to person, place, and time.  Psychiatric:        Mood and Affect: Mood normal.        Behavior: Behavior normal.    Results for orders placed or performed in visit on 03/16/20 (from the past 24 hour(s))  POCT urinalysis dipstick     Status: Abnormal   Collection Time: 03/16/20  4:39 PM  Result Value Ref Range   Color, UA yellow yellow   Clarity, UA clear clear   Glucose, UA negative negative mg/dL   Bilirubin, UA negative negative   Ketones, POC UA negative negative mg/dL   Spec Grav, UA 1.020 1.010 - 1.025   Blood, UA small (A) negative   pH, UA 6.0 5.0 - 8.0   Protein Ur, POC negative  negative mg/dL   Urobilinogen, UA 0.2 0.2 or 1.0 E.U./dL   Nitrite, UA Negative Negative   Leukocytes, UA Negative Negative     ASSESSMENT & PLAN: Patricia Mitchell was seen today for establish care and flank pain.  Diagnoses and all orders for this visit:  Encounter to establish care  Dyslipidemia  Flank pain -     POCT  Microscopic Urinalysis ([facility]) -     POCT urinalysis dipstick  Premature surgical menopause -     Ambulatory referral to Gynecology -     HM DEXA SCAN  Osteoporosis screening -     [facility] DEXA SCAN    Patient Instructions       If you have lab work done today you will be contacted with your lab results within the next 2 weeks.  If you have not heard from [place] then please contact us. The fastest way to get your results is to register for My Chart.   IF you received an x-ray today, you will receive an invoice from [facility]. Please contact [facility] at [phone] with questions or concerns regarding your invoice.   IF you received labwork today, you will receive an invoice from [place]. Please contact [facility] at [phone] with questions or concerns regarding your invoice.   Our billing staff will not be able to assist you with questions regarding bills from these companies.  You will be contacted with the lab results as soon as they are available. The fastest way to get your results is to activate your My Chart account. Instructions are located on the last page of this paperwork. If you have not heard from [place] regarding the results in 2 weeks, please contact this office.     Mantenimiento de Technical sales engineer en [place] Maintenance, Female Adoptar un estilo de vida saludable y recibir atencin preventiva son importantes para promover la salud y Musician. Consulte al mdico sobre:  El esquema adecuado para hacerse pruebas y exmenes peridicos.  Cosas que puede hacer por su cuenta para prevenir enfermedades y [employer]. Qu  debo saber sobre la dieta, el peso y el ejercicio? Consuma una dieta saludable  Consuma una dieta que incluya muchas verduras, frutas, productos lcteos con bajo contenido de [place] y Advertising account planner.  No consuma muchos alimentos ricos en grasas slidas, azcares agregados o sodio.   Mantenga un peso saludable El ndice de masa muscular [facility]) se [place] para identificar problemas de [place]. Proporciona una estimacin de la grasa corporal basndose en el peso y la altura. Su mdico puede ayudarle a Radiation protection practitioner [place] y a Scientist, forensic o Theatre manager un peso saludable. Haga ejercicio con regularidad Haga ejercicio con regularidad. Esta es una de las prcticas ms importantes que puede hacer por su salud. La mayora de los adultos deben seguir estas pautas:  Optometrist, al menos, 137minutos de actividad fsica por semana. El ejercicio debe aumentar la frecuencia cardaca y Nature conservation officer transpirar (ejercicio de intensidad moderada).  Hacer ejercicios de fortalecimiento por lo [employer] por semana. Agregue esto a su plan de ejercicio de intensidad moderada.  Pasar menos tiempo sentados. Incluso la actividad fsica ligera puede ser beneficiosa. Controle sus niveles de colesterol y lpidos en la sangre Comience a realizarse anlisis de lpidos y Research officer, trade union en la sangre a los 20aos y luego reptalos cada 5aos. Hgase controlar los niveles de colesterol con mayor frecuencia si:  Sus niveles de lpidos y colesterol son altos.  Es mayor de 40aos.  Presenta un alto riesgo de padecer enfermedades cardacas. Qu debo saber sobre las pruebas de deteccin del cncer? Segn su historia clnica y sus antecedentes familiares, es posible que deba realizarse pruebas de deteccin del cncer en diferentes edades. Esto puede incluir pruebas de deteccin de lo siguiente:  Cncer de mama.  Cncer de cuello uterino.  Cncer colorrectal.  Cncer de piel.  Cncer de pulmn. Sander Nephew  debo saber sobre la enfermedad  cardaca, la diabetes y la hipertensin arterial? Presin arterial y enfermedad cardaca  La hipertensin arterial causa enfermedades cardacas y [place] el riesgo de accidente cerebrovascular. Es ms probable que esto se manifieste en las personas que tienen lecturas de presin arterial alta, tienen ascendencia africana o tienen sobrepeso.  Hgase controlar la presin arterial: ? Cada 3 a 5 aos si tiene entre 18 y 74 aos. ? Todos los aos si es mayor de [place]. Diabetes Realcese exmenes de deteccin de la diabetes con regularidad. Este anlisis revisa el nivel de azcar en la sangre en [place]. Hgase las pruebas de deteccin:  Cada tresaos despus de los 54aos de edad si tiene un peso normal y un bajo riesgo de padecer diabetes.  Con ms frecuencia y a partir de [place] edad inferior si tiene sobrepeso o un alto riesgo de padecer diabetes. Qu debo saber sobre la prevencin de infecciones? Hepatitis B Si tiene un riesgo ms alto de contraer hepatitis B, debe someterse a un examen de deteccin de este virus. Hable con el mdico para averiguar si tiene riesgo de contraer la infeccin por hepatitis B. Hepatitis C Se recomienda el anlisis a:  [employer] 1945 y 1965.  Todas las personas que tengan un riesgo de haber contrado hepatitis C. Enfermedades de transmisin sexual (ETS)  Hgase las pruebas de Programme researcher, broadcasting/film/video de ITS, incluidas la gonorrea y la clamidia, si: ? Es sexualmente activa y es menor de [place]. ? Es mayor de 24aos, y Investment banker, operational informa que corre riesgo de tener este tipo de infecciones. ? La actividad sexual ha cambiado desde que le hicieron la ltima prueba de deteccin y tiene un riesgo mayor de Best boy clamidia o Radio broadcast assistant. Pregntele al mdico si usted tiene riesgo.  Pregntele al mdico si usted tiene un alto riesgo de Museum/gallery curator VIH. El mdico tambin puede recomendarle un medicamento recetado para ayudar a evitar la infeccin por el VIH. Si elige  tomar medicamentos para prevenir el VIH, primero debe [employer] de deteccin del VIH. Luego debe hacerse anlisis cada 2meses mientras est tomando los medicamentos. Embarazo  Si est por dejar de Librarian, academic (fase premenopusica) y usted puede quedar [place], busque asesoramiento antes de [place].  Tome de 400 a [id number] (mcg) de cido [employer] si [place].  Pida mtodos de control de la natalidad (anticonceptivos) si desea evitar un embarazo no deseado. Osteoporosis y [place] La osteoporosis es una enfermedad en la que los huesos pierden los minerales y la fuerza por el avance de la edad. El resultado pueden ser fracturas en los [place]. Si tiene 65aos o ms, o si est en riesgo de sufrir osteoporosis y fracturas, pregunte a su mdico si debe:  Hacerse pruebas de deteccin de prdida sea.  Tomar un suplemento de calcio o de vitamina D para reducir el riesgo de fracturas.  Recibir terapia de reemplazo hormonal (TRH) para tratar los sntomas de la menopausia. Siga estas instrucciones en su casa: Estilo de vida  No consuma ningn producto que contenga nicotina o tabaco, como cigarrillos, cigarrillos electrnicos y tabaco de Higher education careers adviser. Si necesita ayuda para dejar de fumar, consulte al mdico.  No consuma drogas.  No comparta agujas.  Solicite ayuda a su mdico si necesita apoyo o informacin para abandonar las drogas. Consumo de alcohol  No beba alcohol si: ? Su mdico le indica no hacerlo. ? Est embarazada, puede estar embarazada o est tratando de quedar embarazada.  Si  bebe alcohol: ? Limite la cantidad que consume de 0 a 1 medida por da. ? Limite la ingesta si est amamantando.  Est atento a la cantidad de alcohol que hay en las bebidas que toma. En los [place], una medida equivale a una botella de cerveza de 12oz (33ml), un vaso de vino de 5oz (121ml) o un vaso de una bebida alcohlica de alta graduacin de  1oz (46ml). Instrucciones generales  Realcese los estudios de rutina de la salud, dentales y de Public librarian.  [place].  Infrmele a su mdico si: ? Se siente deprimida con frecuencia. ? Alguna vez ha sido vctima de [place] o no se siente segura en su casa. Resumen  Adoptar un estilo de vida saludable y recibir atencin preventiva son importantes para promover la salud y Musician.  Siga las instrucciones del mdico acerca de una dieta saludable, el ejercicio y la realizacin de pruebas o exmenes para Engineer, building services.  Siga las instrucciones del mdico con respecto al control del colesterol y la presin arterial. Esta informacin no tiene Marine scientist el consejo del mdico. Asegrese de hacerle al mdico cualquier pregunta que tenga. Document Revised: 02/13/2018 Document Reviewed: 02/13/2018 Elsevier Patient Education  2021 Elsevier Inc.      Agustina Caroli, MD [facility]

## 2020-03-16 NOTE — Patient Instructions (Addendum)
   If you have lab work done today you will be contacted with your lab results within the next 2 weeks.  If you have not heard from us then please contact us. The fastest way to get your results is to register for My Chart.   IF you received an x-ray today, you will receive an invoice from [facility]. Please contact [facility] at [phone] with questions or concerns regarding your invoice.   IF you received labwork today, you will receive an invoice from [facility]. Please contact [facility] at [phone] with questions or concerns regarding your invoice.   Our billing staff will not be able to assist you with questions regarding bills from these companies.  You will be contacted with the lab results as soon as they are available. The fastest way to get your results is to activate your My Chart account. Instructions are located on the last page of this paperwork. If you have not heard from us regarding the results in 2 weeks, please contact this office.      Mantenimiento de la salud en las mujeres Health Maintenance, Female Adoptar un estilo de vida saludable y recibir atencin preventiva son importantes para promover la salud y el bienestar. Consulte al mdico sobre:  El esquema adecuado para hacerse pruebas y exmenes peridicos.  Cosas que puede hacer por su cuenta para prevenir enfermedades y mantenerse sana. Qu debo saber sobre la dieta, el peso y el ejercicio? Consuma una dieta saludable  Consuma una dieta que incluya muchas verduras, frutas, productos lcteos con bajo contenido de grasa y protenas magras.  No consuma muchos alimentos ricos en grasas slidas, azcares agregados o sodio.   Mantenga un peso saludable El ndice de masa muscular (IMC) se utiliza para identificar problemas de peso. Proporciona una estimacin de la grasa corporal basndose en el peso y la altura. Su mdico puede ayudarle a determinar su IMC y a lograr o mantener un peso  saludable. Haga ejercicio con regularidad Haga ejercicio con regularidad. Esta es una de las prcticas ms importantes que puede hacer por su salud. La mayora de los adultos deben seguir estas pautas:  Realizar, al menos, 150minutos de actividad fsica por semana. El ejercicio debe aumentar la frecuencia cardaca y hacerlo transpirar (ejercicio de intensidad moderada).  Hacer ejercicios de fortalecimiento por lo menos dos veces por semana. Agregue esto a su plan de ejercicio de intensidad moderada.  Pasar menos tiempo sentados. Incluso la actividad fsica ligera puede ser beneficiosa. Controle sus niveles de colesterol y lpidos en la sangre Comience a realizarse anlisis de lpidos y colesterol en la sangre a los 20aos y luego reptalos cada 5aos. Hgase controlar los niveles de colesterol con mayor frecuencia si:  Sus niveles de lpidos y colesterol son altos.  Es mayor de 40aos.  Presenta un alto riesgo de padecer enfermedades cardacas. Qu debo saber sobre las pruebas de deteccin del cncer? Segn su historia clnica y sus antecedentes familiares, es posible que deba realizarse pruebas de deteccin del cncer en diferentes edades. Esto puede incluir pruebas de deteccin de lo siguiente:  Cncer de mama.  Cncer de cuello uterino.  Cncer colorrectal.  Cncer de piel.  Cncer de pulmn. Qu debo saber sobre la enfermedad cardaca, la diabetes y la hipertensin arterial? Presin arterial y enfermedad cardaca  La hipertensin arterial causa enfermedades cardacas y aumenta el riesgo de accidente cerebrovascular. Es ms probable que esto se manifieste en las personas que tienen lecturas de presin arterial alta, tienen ascendencia   africana o tienen sobrepeso.  Hgase controlar la presin arterial: ? Cada 3 a 5 aos si tiene entre 18 y 39 aos. ? Todos los aos si es mayor de 40aos. Diabetes Realcese exmenes de deteccin de la diabetes con regularidad. Este  anlisis revisa el nivel de azcar en la sangre en ayunas. Hgase las pruebas de deteccin:  Cada tresaos despus de los 40aos de edad si tiene un peso normal y un bajo riesgo de padecer diabetes.  Con ms frecuencia y a partir de una edad inferior si tiene sobrepeso o un alto riesgo de padecer diabetes. Qu debo saber sobre la prevencin de infecciones? Hepatitis B Si tiene un riesgo ms alto de contraer hepatitis B, debe someterse a un examen de deteccin de este virus. Hable con el mdico para averiguar si tiene riesgo de contraer la infeccin por hepatitis B. Hepatitis C Se recomienda el anlisis a:  Todos los que nacieron entre 1945 y 1965.  Todas las personas que tengan un riesgo de haber contrado hepatitis C. Enfermedades de transmisin sexual (ETS)  Hgase las pruebas de deteccin de ITS, incluidas la gonorrea y la clamidia, si: ? Es sexualmente activa y es menor de 24aos. ? Es mayor de 24aos, y el mdico le informa que corre riesgo de tener este tipo de infecciones. ? La actividad sexual ha cambiado desde que le hicieron la ltima prueba de deteccin y tiene un riesgo mayor de tener clamidia o gonorrea. Pregntele al mdico si usted tiene riesgo.  Pregntele al mdico si usted tiene un alto riesgo de contraer VIH. El mdico tambin puede recomendarle un medicamento recetado para ayudar a evitar la infeccin por el VIH. Si elige tomar medicamentos para prevenir el VIH, primero debe hacerse los anlisis de deteccin del VIH. Luego debe hacerse anlisis cada 3meses mientras est tomando los medicamentos. Embarazo  Si est por dejar de menstruar (fase premenopusica) y usted puede quedar embarazada, busque asesoramiento antes de quedar embarazada.  Tome de 400 a 800microgramos (mcg) de cido flico todos los das si queda embarazada.  Pida mtodos de control de la natalidad (anticonceptivos) si desea evitar un embarazo no deseado. Osteoporosis y menopausia La  osteoporosis es una enfermedad en la que los huesos pierden los minerales y la fuerza por el avance de la edad. El resultado pueden ser fracturas en los huesos. Si tiene 65aos o ms, o si est en riesgo de sufrir osteoporosis y fracturas, pregunte a su mdico si debe:  Hacerse pruebas de deteccin de prdida sea.  Tomar un suplemento de calcio o de vitamina D para reducir el riesgo de fracturas.  Recibir terapia de reemplazo hormonal (TRH) para tratar los sntomas de la menopausia. Siga estas instrucciones en su casa: Estilo de vida  No consuma ningn producto que contenga nicotina o tabaco, como cigarrillos, cigarrillos electrnicos y tabaco de mascar. Si necesita ayuda para dejar de fumar, consulte al mdico.  No consuma drogas.  No comparta agujas.  Solicite ayuda a su mdico si necesita apoyo o informacin para abandonar las drogas. Consumo de alcohol  No beba alcohol si: ? Su mdico le indica no hacerlo. ? Est embarazada, puede estar embarazada o est tratando de quedar embarazada.  Si bebe alcohol: ? Limite la cantidad que consume de 0 a 1 medida por da. ? Limite la ingesta si est amamantando.  Est atento a la cantidad de alcohol que hay en las bebidas que toma. En los [place], una medida equivale a una botella de cerveza de   12oz (355ml), un vaso de vino de 5oz (148ml) o un vaso de una bebida alcohlica de alta graduacin de 1oz (44ml). Instrucciones generales  Realcese los estudios de rutina de la salud, dentales y de la vista.  Mantngase al da con las vacunas.  Infrmele a su mdico si: ? Se siente deprimida con frecuencia. ? Alguna vez ha sido vctima de maltrato o no se siente segura en su casa. Resumen  Adoptar un estilo de vida saludable y recibir atencin preventiva son importantes para promover la salud y el bienestar.  Siga las instrucciones del mdico acerca de una dieta saludable, el ejercicio y la realizacin de pruebas o exmenes  para detectar enfermedades.  Siga las instrucciones del mdico con respecto al control del colesterol y la presin arterial. Esta informacin no tiene como fin reemplazar el consejo del mdico. Asegrese de hacerle al mdico cualquier pregunta que tenga. Document Revised: 02/13/2018 Document Reviewed: 02/13/2018 Elsevier Patient Education  2021 Elsevier Inc.  

## 2020-05-10 ENCOUNTER — Ambulatory Visit (INDEPENDENT_AMBULATORY_CARE_PROVIDER_SITE_OTHER): Payer: 59 | Admitting: Obstetrics and Gynecology

## 2020-05-10 ENCOUNTER — Encounter: Payer: Self-pay | Admitting: Obstetrics and Gynecology

## 2020-05-10 ENCOUNTER — Encounter: Payer: 59 | Admitting: Obstetrics & Gynecology

## 2020-05-10 ENCOUNTER — Other Ambulatory Visit: Payer: Self-pay

## 2020-05-10 VITALS — BP 142/80 | HR 72 | Ht 62.0 in | Wt 158.0 lb

## 2020-05-10 DIAGNOSIS — G8929 Other chronic pain: Secondary | ICD-10-CM | POA: Diagnosis not present

## 2020-05-10 DIAGNOSIS — R1032 Left lower quadrant pain: Secondary | ICD-10-CM | POA: Diagnosis not present

## 2020-05-10 NOTE — Progress Notes (Signed)
GYNECOLOGY  VISIT   HPI: 60 y.o.   Single  Hispanic  female   G0P0000 with No LMP recorded. Patient has had a hysterectomy.   here for LLQ pain which radiates around back and down buttocks for 3 months. She denies any urinary symptoms. Her PCP did a urine test and it was negative.   Spanish interpretor present.   The pain comes and goes.  Not associated with nausea, vomiting, diarrhea or constipation.  The pain is worsened by exercise and position changes in bed at night.  States sometimes she thinks it is arthritis.  Pain improved by anti-inflammatory tea and rubbing alcohol on the skin over the area. The pain does not prevent her form eating or sleeping.   Denies pain with urination and blood in the urine.   States she has had this pain in her left lower abdomen her whole life, but now it is radiating in to her back.   Status post supracervical hysterectomy and bilateral oophorectomy.  Patient wonders if she has adhesions from her surgery.   No vaginal bleeding or vaginal discharge.   Asking about pap smear.  Had a normal colonoscopy last year.   Works in Nurse, mental health.  No recent injury.   GYNECOLOGIC HISTORY: No LMP recorded. Patient has had a hysterectomy. Contraception:  Hyst--patient states has partial cervix. Menopausal hormone therapy: none Last mammogram:  12-10-19 KPT:[id number] Last pap smear:  06-04-17 Neg, 10-19-11 Neg        OB History    Gravida  0   Para  0   Term  0   Preterm  0   AB  0   Living  0     SAB  0   IAB  0   Ectopic  0   Multiple  0   Live Births  0              Patient Active Problem List   Diagnosis Date Noted  . Adenomatous polyp 05/23/2013  . Osteopenia 10/19/2011  . 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 Hyperlipidemia 07/11/2011  . Constipation 07/11/2011  . Well woman exam 07/11/2011    Past Medical History:  Diagnosis Date  . Endometriosis of ovary   . Hyperlipidemia   . Osteoporosis     Past  Surgical History:  Procedure Laterality Date  . CYST REMOVAL HAND Bilateral 2010  . [place]   right taken out in 1995, left taken out in 1997 b/c of cysts  . VAGINAL HYSTERECTOMY  2000    Current Outpatient Medications  Medication Sig Dispense Refill  . alendronate (FOSAMAX) 35 MG tablet Take 35 mg by mouth once a week.    Marland Kitchen atorvastatin (LIPITOR) 40 MG tablet Take 1 tablet (40 mg total) by mouth daily. 90 tablet 3  . Calcium Carb-Cholecalciferol (CALCIUM + D3 PO) Take by mouth daily.    . Misc Natural Products (JOINT SUPPORT PO) Take 2 capsules by mouth daily.    . vitamin B-12 (CYANOCOBALAMIN) 100 MCG tablet Take 100 mcg by mouth daily.    . vitamin E 200 UNIT capsule Take 200 Units by mouth daily.     No current facility-administered medications for this visit.     ALLERGIES: Patient has no known allergies.  Family History  Problem Relation Age of Onset  . Prostate cancer Father   . Cervical cancer Mother 57       dec  . Colon cancer Neg Hx  Social History   Socioeconomic History  . Marital status: Single    Spouse name: Not on file  . Number of children: Not on file  . Years of education: Not on file  . Highest education level: Not on file  Occupational History  . Not on file  Tobacco Use  . Smoking status: Never Smoker  . Smokeless tobacco: Never Used  Vaping Use  . Vaping Use: Never used  Substance and Sexual Activity  . Alcohol use: No  . Drug use: No  . Sexual activity: Never    Birth control/protection: Surgical    Comment: Hyst  Other Topics Concern  . Not on file  Social History Narrative   Spanish speaking   [name] about [place] from a family member   Lives at home with 67 yo brother      Last mammogram in 1996 (normal)   Last PAP in 2002 (normal)   Social Determinants of Health   Financial Resource Strain: Not on file  Food Insecurity: Not on file  Transportation Needs: Not on file  Physical Activity: Not on file  Stress:  Not on file  Social Connections: Not on file  Intimate Partner Violence: Not on file    Review of Systems  Genitourinary: Positive for pelvic pain (LLQ).  All other systems reviewed and are negative.   PHYSICAL EXAMINATION:    BP (!) 142/80   Pulse 72   Ht 5\' 2"  (1.575 m)   Wt 158 lb (71.7 kg)   SpO2 100%   BMI 28.90 kg/m     General appearance: alert, cooperative and appears stated age Head: Normocephalic, without obvious abnormality, atraumatic Neck: no adenopathy, supple, symmetrical, trachea midline and thyroid normal to inspection and palpation Lungs: clear to auscultation bilaterally Heart: regular rate and rhythm Abdomen: soft, non-tender, no masses,  no organomegaly Extremities: extremities normal, atraumatic, no cyanosis or edema No abnormal inguinal nodes palpated Neurologic: Grossly normal  Pelvic: External genitalia:  no lesions              Urethra:  normal appearing urethra with no masses, tenderness or lesions              Bartholins and Skenes: normal                 Vagina: normal appearing vagina with normal color and discharge, no lesions              Cervix: no lesions                Bimanual Exam:  Uterus:  absent              Adnexa: no mass, fullness, tenderness              Rectal exam: Yes.  .  Confirms.              Anus:  normal sphincter tone, no lesions  Chaperone was present for exam.  ASSESSMENT  Status post supracervical hysterectomy.  Status post bilateral oophorectomy.  Endometriosis.  Chronic LLQ pain.   PLAN  Will have patient return for pelvic ultrasound.  She declines Rx for NSAID.  We discussed pap protocols.  She will do a pap with her PCP this summer.  We discussed pap and HPV testing, which if normal allow her to do her pap every 5 years.

## 2020-05-18 ENCOUNTER — Other Ambulatory Visit: Payer: Self-pay

## 2020-05-18 ENCOUNTER — Encounter: Payer: Self-pay | Admitting: Obstetrics and Gynecology

## 2020-05-18 ENCOUNTER — Ambulatory Visit (INDEPENDENT_AMBULATORY_CARE_PROVIDER_SITE_OTHER): Payer: 59 | Admitting: Obstetrics and Gynecology

## 2020-05-18 ENCOUNTER — Ambulatory Visit (INDEPENDENT_AMBULATORY_CARE_PROVIDER_SITE_OTHER): Payer: 59

## 2020-05-18 VITALS — BP 134/70 | HR 68 | Ht 62.0 in | Wt 158.0 lb

## 2020-05-18 DIAGNOSIS — G8929 Other chronic pain: Secondary | ICD-10-CM | POA: Diagnosis not present

## 2020-05-18 DIAGNOSIS — R1032 Left lower quadrant pain: Secondary | ICD-10-CM

## 2020-05-18 NOTE — Progress Notes (Signed)
GYNECOLOGY  VISIT   HPI: 60 y.o.   Single  Hispanic  female   G0P0000 with No LMP recorded. Patient has had a hysterectomy.   here for pelvic ultrasound for LLQ pain, which is chronic in nature.   Spanish interpretor is present today for the visit.   Patient has an abdominal binder and started using it this week.  Works in Nurse, mental health.   GYNECOLOGIC HISTORY: No LMP recorded. Patient has had a hysterectomy. Contraception: Hyst Menopausal hormone therapy:  none Last mammogram: 12-10-19 TSV:[id number] Last pap smear: 06-04-17 Neg, 10-19-11 Neg        OB History    Gravida  0   Para  0   Term  0   Preterm  0   AB  0   Living  0     SAB  0   IAB  0   Ectopic  0   Multiple  0   Live Births  0              Patient Active Problem List   Diagnosis Date Noted  . Adenomatous polyp 05/23/2013  . Osteopenia 10/19/2011  . 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 Hyperlipidemia 07/11/2011  . Constipation 07/11/2011  . Well woman exam 07/11/2011    Past Medical History:  Diagnosis Date  . Endometriosis of ovary   . Hyperlipidemia   . Osteoporosis     Past Surgical History:  Procedure Laterality Date  . CYST REMOVAL HAND Bilateral 2010  . [place]   right taken out in 1995, left taken out in 1997 b/c of cysts  . VAGINAL HYSTERECTOMY  2000    Current Outpatient Medications  Medication Sig Dispense Refill  . alendronate (FOSAMAX) 35 MG tablet Take 35 mg by mouth once a week.    Marland Kitchen atorvastatin (LIPITOR) 40 MG tablet Take 1 tablet (40 mg total) by mouth daily. 90 tablet 3  . Calcium Carb-Cholecalciferol (CALCIUM + D3 PO) Take by mouth daily.    . Misc Natural Products (JOINT SUPPORT PO) Take 2 capsules by mouth daily.    . Rhubarb (ESTROVEN MENOPAUSE RELIEF PO) Take by mouth.    . vitamin B-12 (CYANOCOBALAMIN) 100 MCG tablet Take 100 mcg by mouth daily.    . vitamin E 200 UNIT capsule Take 200 Units by mouth daily.     No current  facility-administered medications for this visit.     ALLERGIES: Patient has no known allergies.  Family History  Problem Relation Age of Onset  . Prostate cancer Father   . Cervical cancer Mother 22       dec  . Colon cancer Neg Hx     Social History   Socioeconomic History  . Marital status: Single    Spouse name: Not on file  . Number of children: Not on file  . Years of education: Not on file  . Highest education level: Not on file  Occupational History  . Not on file  Tobacco Use  . Smoking status: Never Smoker  . Smokeless tobacco: Never Used  Vaping Use  . Vaping Use: Never used  Substance and Sexual Activity  . Alcohol use: No  . Drug use: No  . Sexual activity: Never    Birth control/protection: Surgical    Comment: Hyst  Other Topics Concern  . Not on file  Social History Narrative   Spanish speaking   Patricia Mitchell about [place] from a family member   Lives at home  with 81 yo brother      Last mammogram in 1996 (normal)   Last PAP in 2002 (normal)   Social Determinants of Health   Financial Resource Strain: Not on file  Food Insecurity: Not on file  Transportation Needs: Not on file  Physical Activity: Not on file  Stress: Not on file  Social Connections: Not on file  Intimate Partner Violence: Not on file    Review of Systems  All other systems reviewed and are negative.   PHYSICAL EXAMINATION:    BP 134/70 (Cuff Size: Large)   Pulse 68   Ht 5\' 2"  (1.575 m)   Wt 158 lb (71.7 kg)   SpO2 98%   BMI 28.90 kg/m     General appearance: alert, cooperative and appears stated age  Pelvic US Uterus absent.  Cervix present and normal. Ovaries absent.  No adnexal masses.  No free fluid.  ASSESSMENT  LLQ pain, chronic.  Etiology unclear.  Possible adhesive disease. Status post supracervical hysterectomy and bilateral oophorectomy.  PLAN  US findings reviewed with patient and reassurance given. Follow up with PCP if LLQ pain is persistent or  increasing.  We discussed urinary or bowel origins possible for LLQ pain.  She will see if her PCP can do her pap this summer when she returns for her visit.  Fu prn.   20 min  total time was spent for this patient encounter, including preparation, face-to-face counseling with the patient, coordination of care, and documentation of the encounter.

## 2020-09-13 ENCOUNTER — Ambulatory Visit (INDEPENDENT_AMBULATORY_CARE_PROVIDER_SITE_OTHER): Payer: 59 | Admitting: Emergency Medicine

## 2020-09-13 ENCOUNTER — Encounter: Payer: Self-pay | Admitting: Emergency Medicine

## 2020-09-13 ENCOUNTER — Other Ambulatory Visit: Payer: Self-pay

## 2020-09-13 ENCOUNTER — Ambulatory Visit: Payer: 59 | Admitting: Emergency Medicine

## 2020-09-13 VITALS — BP 142/80 | HR 58 | Temp 98.4°F | Ht 62.0 in | Wt 158.0 lb

## 2020-09-13 DIAGNOSIS — E785 Hyperlipidemia, unspecified: Secondary | ICD-10-CM | POA: Diagnosis not present

## 2020-09-13 DIAGNOSIS — Z1329 Encounter for screening for other suspected endocrine disorder: Secondary | ICD-10-CM | POA: Diagnosis not present

## 2020-09-13 DIAGNOSIS — M8588 Other specified disorders of bone density and structure, other site: Secondary | ICD-10-CM

## 2020-09-13 DIAGNOSIS — Z130 Encounter for screening for diseases of the blood and blood-forming organs and certain disorders involving the immune mechanism: Secondary | ICD-10-CM | POA: Diagnosis not present

## 2020-09-13 DIAGNOSIS — Z13228 Encounter for screening for other metabolic disorders: Secondary | ICD-10-CM

## 2020-09-13 DIAGNOSIS — Z13220 Encounter for screening for lipoid disorders: Secondary | ICD-10-CM

## 2020-09-13 DIAGNOSIS — Z0000 Encounter for general adult medical examination without abnormal findings: Secondary | ICD-10-CM

## 2020-09-13 LAB — CBC WITH DIFFERENTIAL/PLATELET
Basophils Absolute: 0 10*3/uL (ref 0.0–0.1)
Basophils Relative: 0.2 % (ref 0.0–3.0)
Eosinophils Absolute: 0 10*3/uL (ref 0.0–0.7)
Eosinophils Relative: 0.1 % (ref 0.0–5.0)
HCT: 40.9 % (ref 36.0–46.0)
Hemoglobin: 13.7 g/dL (ref 12.0–15.0)
Lymphocytes Relative: 16.2 % (ref 12.0–46.0)
Lymphs Abs: 1.3 10*3/uL (ref 0.7–4.0)
MCHC: 33.6 g/dL (ref 30.0–36.0)
MCV: 99.2 fl (ref 78.0–100.0)
Monocytes Absolute: 0.4 10*3/uL (ref 0.1–1.0)
Monocytes Relative: 5.5 % (ref 3.0–12.0)
Neutro Abs: 6.2 10*3/uL (ref 1.4–7.7)
Neutrophils Relative %: 78 % — ABNORMAL HIGH (ref 43.0–77.0)
Platelets: 213 10*3/uL (ref 150.0–400.0)
RBC: 4.12 Mil/uL (ref 3.87–5.11)
RDW: 12.2 % (ref 11.5–15.5)
WBC: 7.9 10*3/uL (ref 4.0–10.5)

## 2020-09-13 LAB — COMPREHENSIVE METABOLIC PANEL
ALT: 22 U/L (ref 0–35)
AST: 15 U/L (ref 0–37)
Albumin: 4.2 g/dL (ref 3.5–5.2)
Alkaline Phosphatase: 68 U/L (ref 39–117)
BUN: 15 mg/dL (ref 6–23)
CO2: 26 mEq/L (ref 19–32)
Calcium: 8.8 mg/dL (ref 8.4–10.5)
Chloride: 106 mEq/L (ref 96–112)
Creatinine, Ser: 0.77 mg/dL (ref 0.40–1.20)
GFR: 83.83 mL/min (ref >60.00)
Glucose, Bld: 91 mg/dL (ref 70–99)
Potassium: 3.6 mEq/L (ref 3.5–5.1)
Sodium: 140 mEq/L (ref 135–145)
Total Bilirubin: 0.6 mg/dL (ref 0.2–1.2)
Total Protein: 6.8 g/dL (ref 6.0–8.3)

## 2020-09-13 LAB — LIPID PANEL
Cholesterol: 173 mg/dL (ref 0–200)
HDL: 73.8 mg/dL (ref >39.00)
LDL Cholesterol: 78 mg/dL (ref 0–99)
NonHDL: 99.46
Total CHOL/HDL Ratio: 2
Triglycerides: 109 mg/dL (ref 0.0–149.0)
VLDL: 21.8 mg/dL (ref 0.0–40.0)

## 2020-09-13 LAB — TSH: TSH: 1.25 u[IU]/mL (ref 0.35–5.50)

## 2020-09-13 LAB — HEMOGLOBIN A1C: Hgb A1c MFr Bld: 6.1 % (ref 4.6–6.5)

## 2020-09-13 MED ORDER — ATORVASTATIN CALCIUM 40 MG PO TABS: 40.0000 mg | ORAL_TABLET | Freq: Every day | ORAL | 3 refills | Status: DC

## 2020-09-13 MED ORDER — ALENDRONATE SODIUM 35 MG PO TABS: 35.0000 mg | ORAL_TABLET | ORAL | 3 refills | Status: AC

## 2020-09-13 NOTE — Patient Instructions (Signed)
Mantenimiento de Technical sales engineer en [place] Maintenance, Female Adoptar un estilo de vida saludable y recibir atencin preventiva son importantes para promover la salud y Musician. Consulte al mdico sobre: El esquema adecuado para hacerse pruebas y exmenes peridicos. Cosas que puede hacer por su cuenta para prevenir enfermedades y [employer]. Qu debo saber sobre la dieta, el peso y el ejercicio? Consuma una dieta saludable  Consuma una dieta que incluya muchas verduras, frutas, productos lcteos con bajo contenido de [place] y Advertising account planner. No consuma muchos alimentos ricos en grasas slidas, azcares agregados o sodio.  Mantenga un peso saludable El ndice de masa muscular [facility]) se [place] para identificar problemas de [place]. Proporciona una estimacin de la grasa corporal basndose en el peso y la altura. Su mdico puede ayudarle a Radiation protection practitioner [place] y a Scientist, forensic o Theatre manager unpeso saludable. Haga ejercicio con regularidad Haga ejercicio con regularidad. Esta es una de las prcticas ms importantes que puede hacer por su salud. La mayora de los adultos deben seguir estas pautas: Optometrist, al menos, 145mnutos de actividad fsica por semana. El ejercicio debe aumentar la frecuencia cardaca y hNature conservation officertranspirar (ejercicio de intensidad moderada). Hacer ejercicios de fortalecimiento por lo mHalliburton Companypor semana. Agregue esto a su plan de ejercicio de intensidad moderada. Pasar menos tiempo sentados. Incluso la actividad fsica ligera puede ser beneficiosa. Controle sus niveles de colesterol y lpidos en la sangre Comience a realizarse anlisis de lpidos y colesterol en la sangre a los20aos y luego reptalos cada 5aos. Hgase controlar los niveles de colesterol con mayor frecuencia si: Sus niveles de lpidos y colesterol son altos. Es mayor de 40aos. Presenta un alto riesgo de padecer enfermedades cardacas. Qu debo saber sobre las pruebas de deteccin del  cncer? Segn su historia clnica y sus antecedentes familiares, es posible que deba realizarse pruebas de deteccin del cncer en diferentes edades. Esto puede incluir pruebas de deteccin de lo siguiente: Cncer de mama. Cncer de cuello uterino. Cncer colorrectal. Cncer de piel. Cncer de pulmn. Qu debo saber sobre la enfermedad cardaca, la diabetes y la hipertensinarterial? Presin arterial y enfermedad cardaca La hipertensin arterial causa enfermedades cardacas y aSerbiael riesgo de accidente cerebrovascular. Es ms probable que esto se manifieste en las personas que tienen lecturas de presin arterial alta, tienen ascendencia africana o tienen sobrepeso. Hgase controlar la presin arterial: Cada 3 a 5 aos si tiene entre 18 y 322aos. Todos los aos si es mayor de 4[place] Diabetes Realcese exmenes de deteccin de la diabetes con regularidad. Este anlisis revisa el nivel de azcar en la sangre en aStreamwood Hgase las pruebas de deteccin: Cada tresaos despus de los 442aosde edad si tiene un peso normal y un bajo riesgo de padecer diabetes. Con ms frecuencia y a partir de uCoyanosaedad inferior si tiene sobrepeso o un alto riesgo de padecer diabetes. Qu debo saber sobre la prevencin de infecciones? Hepatitis B Si tiene un riesgo ms alto de contraer hepatitis B, debe someterse a un examen de deteccin de este virus. Hable con el mdico para averiguar si tiene riesgode contraer la infeccin por hepatitis B. Hepatitis C Se recomienda el anlisis a: THexion Specialty Chemicals1945 y 1965. Todas las personas que tengan un riesgo de haber contrado hepatitis C. Enfermedades de transmisin sexual (ETS) Hgase las pruebas de dProgramme researcher, broadcasting/film/videode ITS, incluidas la gonorrea y la clamidia, si: Es sexualmente activa y es menor de 2[place] Es mayor de 24aos, y eProbation officer  le informa que corre riesgo de tener este tipo de infecciones. La actividad sexual ha cambiado desde que le  hicieron la ltima prueba de deteccin y tiene un riesgo mayor de Best boy clamidia o Radio broadcast assistant. Pregntele al mdico si usted tiene riesgo. Pregntele al mdico si usted tiene un alto riesgo de Museum/gallery curator VIH. El mdico tambin puede recomendarle un medicamento recetado para ayudar a evitar la infeccin por el VIH. Si elige tomar medicamentos para prevenir el VIH, primero debe [employer] de deteccin del VIH. Luego debe hacerse anlisis cada 22mses mientras est tomando los medicamentos. Embarazo Si est por dejar de mLibrarian, academic(fase premenopusica) y usted puede quedar eSchuylkill [place] busque asesoramiento antes de qBotsw[place] Tome de 400 a [id number](mcg) de cido fAnheuser-Buschsi qI[place] Pida mtodos de control de la natalidad (anticonceptivos) si desea evitar un embarazo no deseado. Osteoporosis y mBrazilLa osteoporosis es una enfermedad en la que los huesos pierden los minerales y la fuerza por el avance de la edad. El resultado pueden ser fracturas en los hPottsboro Si tiene 65aos o ms, o si est en riesgo de sufrir osteoporosis y fracturas, pregunte a su mdico si debe: Hacerse pruebas de deteccin de prdida sea. Tomar un suplemento de calcio o de vitamina D para reducir el riesgo de fracturas. Recibir terapia de reemplazo hormonal (TRH) para tratar los sntomas de la menopausia. Siga estas instrucciones en su casa: Estilo de vida No consuma ningn producto que contenga nicotina o tabaco, como cigarrillos, cigarrillos electrnicos y tabaco de mHigher education careers adviser Si necesita ayuda para dejar de fumar, consulte al mdico. No consuma drogas. No comparta agujas. Solicite ayuda a su mdico si necesita apoyo o informacin para abandonar las drogas. Consumo de alcohol No beba alcohol si: Su mdico le indica no hacerlo. Est embarazada, puede estar embarazada o est tratando de qBotsw[place] Si bebe alcohol: Limite la cantidad que consume de 0 a 1 medida por  da. Limite la ingesta si est amamantando. Est atento a la cantidad de alcohol que hay en las bebidas que toma. En los EManning una medida equivale a una botella de cerveza de 12oz (3513m, un vaso de vino de 5oz (14897mo un vaso de una bebida alcohlica de alta graduacin de 1oz (24m36mInstrucciones generales Realcese los estudios de rutina de la salud, dentales y de la vPublic librarianntPaysonfrmele a su mdico si: Se siente deprimida con frecuencia. Alguna vez ha sido vctima de maltWest Fallso se siente segura en su casa. Resumen Adoptar un estilo de vida saludable y recibir atencin preventiva son importantes para promover la salud y el bMusicianga las instrucciones del mdico acerca de una dieta saludable, el ejercicio y la realizacin de pruebas o exmenes para deteEngineer, building servicesga las instrucciones del mdico con respecto al control del colesterol y la presin arterial. Esta informacin no tiene comoMarine scientistconsejo del mdico. Asegresede hacerle al mdico cualquier pregunta que tenga. Document Revised: 02/13/2018 Document Reviewed: 02/13/2018 Elsevier Patient Education  2022[place]

## 2020-09-13 NOTE — Progress Notes (Signed)
Patricia Mitchell 60 y.o.   Chief Complaint  Patient presents with   Annual Exam    Pt need refill on fosamax    HISTORY OF PRESENT ILLNESS: This is a 60 y.o. female here for annual exam. Has history of osteopenia on alendronate 35 mg once a week. History of dyslipidemia on atorvastatin 40 mg daily. Physically active at work.  Good nutrition.  Non-smoker. Saw her gynecologist last April.  Has a history of hysterectomy. Has no complaints or medical concerns today. Up-to-date with mammogram and colonoscopy.  HPI   Prior to Admission medications   Medication Sig Start Date End Date Taking? Authorizing Provider  alendronate (FOSAMAX) 35 MG tablet Take 35 mg by mouth once a week. 03/01/20  Yes [provider]  atorvastatin (LIPITOR) 40 MG tablet Take 1 tablet (40 mg total) by mouth daily. 09/03/19  Yes Patricia Mitchell, Patricia Argue, MD  Calcium Carb-Cholecalciferol (CALCIUM + D3 PO) Take by mouth daily.   Yes [provider]  Misc Natural Products (JOINT SUPPORT PO) Take 2 capsules by mouth daily.   Yes [provider]  Rhubarb (ESTROVEN MENOPAUSE RELIEF PO) Take by mouth.   Yes [provider]  vitamin B-12 (CYANOCOBALAMIN) 100 MCG tablet Take 100 mcg by mouth daily.   Yes [provider]  vitamin E 200 UNIT capsule Take 200 Units by mouth daily.   Yes [provider]  fluticasone (FLONASE) 50 MCG/ACT nasal spray Place 2 sprays into both nostrils daily. 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   Hyperlipidemia 07/11/2011   Constipation 07/11/2011   Well woman exam 07/11/2011    Past Medical History:  Diagnosis Date   Endometriosis of ovary    Hyperlipidemia    Osteoporosis     Past Surgical History:  Procedure Laterality Date   CYST REMOVAL HAND Bilateral 2010    OOPHORECTOMY  1995, 1997   right taken out in 1995, left taken out in 1997 b/c of cysts   VAGINAL HYSTERECTOMY  2000    Social History   Socioeconomic History   Marital status: Single    Spouse name: Not on file   Number of children: Not on file   Years of education: Not on file   Highest education level: Not on file  Occupational History   Not on file  Tobacco Use   Smoking status: Never   Smokeless tobacco: Never  Vaping Use   Vaping Use: Never used  Substance and Sexual Activity   Alcohol use: No   Drug use: No   Sexual activity: Never    Birth control/protection: Surgical    Comment: Hyst  Other Topics Concern   Not on file  Social History Narrative   Spanish speaking   Patricia Mitchell about [place] from a family member   Lives at home with 30 yo brother      Last mammogram in 1996 (normal)   Last PAP in 2002 (normal)   Social Determinants of Health   Financial Resource Strain: Not on file  Food Insecurity: Not on file  Transportation Needs: Not on file  Physical Activity: Not on file  Stress: Not on file  Social Connections: Not on file  Intimate Partner Violence: Not on file    Family History  Problem Relation Age of Onset   Prostate cancer  Father    Cervical cancer Mother 73       dec   Colon cancer Neg Hx      Review of Systems  Constitutional: Negative.  Negative for chills and fever.  HENT: Negative.  Negative for congestion and sore throat.   Respiratory: Negative.  Negative for cough and shortness of breath.   Cardiovascular: Negative.  Negative for chest pain and palpitations.  Gastrointestinal: Negative.  Negative for abdominal pain, blood in stool, diarrhea, melena, nausea and vomiting.  Genitourinary: Negative.  Negative for dysuria and hematuria.  Skin: Negative.  Negative for rash.  Neurological: Negative.  Negative for dizziness and headaches.  All other systems reviewed and are negative.   Physical Exam Vitals reviewed.  Constitutional:       Appearance: Normal appearance.  HENT:     Head: Normocephalic.     Right Ear: Tympanic membrane, ear canal and external ear normal.     Left Ear: Tympanic membrane, ear canal and external ear normal.     Mouth/Throat:     Mouth: Mucous membranes are moist.     Pharynx: Oropharynx is clear.  Eyes:     Extraocular Movements: Extraocular movements intact.     Conjunctiva/sclera: Conjunctivae normal.     Pupils: Pupils are equal, round, and reactive to light.  Neck:     Vascular: No carotid bruit.  Cardiovascular:     Rate and Rhythm: Normal rate and regular rhythm.     Pulses: Normal pulses.     Heart sounds: Normal heart sounds.  Pulmonary:     Effort: Pulmonary effort is normal.     Breath sounds: Normal breath sounds.  Abdominal:     General: Bowel sounds are normal. There is no distension.     Palpations: Abdomen is soft. There is no mass.     Tenderness: There is no abdominal tenderness.  Musculoskeletal:        General: Normal range of motion.     Cervical back: Normal range of motion and neck supple. No tenderness.     Right lower leg: No edema.     Left lower leg: No edema.  Lymphadenopathy:     Cervical: No cervical adenopathy.  Skin:    General: Skin is warm and dry.     Capillary Refill: Capillary refill takes less than 2 seconds.  Neurological:     General: No focal deficit present.     Mental Status: She is alert and oriented to person, place, and time.  Psychiatric:        Mood and Affect: Mood normal.        Behavior: Behavior normal.     ASSESSMENT & PLAN: Patricia Mitchell was seen today for annual exam.  Diagnoses and all orders for this visit:  Routine general medical examination at a health care facility  Osteopenia of lumbar spine -     alendronate (FOSAMAX) 35 MG tablet; Take 1 tablet (35 mg total) by mouth once a week.  Dyslipidemia -     atorvastatin (LIPITOR) 40 MG tablet; Take 1 tablet (40 mg total) by mouth daily. -     Lipid  panel  Screening for deficiency anemia -     CBC with Differential  Screening for lipoid disorders  Screening for endocrine, metabolic and immunity disorder -     Comprehensive metabolic panel -     Hemoglobin A1c -     TSH  Modifiable risk factors discussed with patient. Anticipatory guidance according to  age provided. The following topics were also discussed: Social Determinants of Health Smoking Diet and nutrition and need to decrease amount of daily carbohydrate intake Benefits of exercise Cancer screening and review of most recent mammograms and colonoscopies Vaccinations recommendations Cardiovascular risk assessment Mental health including depression and anxiety Fall and accident prevention  Patient Instructions  Mantenimiento de Technical sales engineer en [place] Maintenance, Female Adoptar un estilo de vida saludable y recibir atencin preventiva son importantes para promover la salud y Musician. Consulte al mdico sobre: El esquema adecuado para hacerse pruebas y exmenes peridicos. Cosas que puede hacer por su cuenta para prevenir enfermedades y [employer]. Qu debo saber sobre la dieta, el peso y el ejercicio? Consuma una dieta saludable  Consuma una dieta que incluya muchas verduras, frutas, productos lcteos con bajo contenido de [place] y Advertising account planner. No consuma muchos alimentos ricos en grasas slidas, azcares agregados o sodio.  Mantenga un peso saludable El ndice de masa muscular [facility]) se [place] para identificar problemas de [place]. Proporciona una estimacin de la grasa corporal basndose en el peso y la altura. Su mdico puede ayudarle a Radiation protection practitioner [place] y a Scientist, forensic o Theatre manager unpeso saludable. Haga ejercicio con regularidad Haga ejercicio con regularidad. Esta es una de las prcticas ms importantes que puede hacer por su salud. [employer] de los adultos deben seguir estas pautas: Optometrist, al menos, 150 minutos de actividad fsica por semana.  El ejercicio debe aumentar la frecuencia cardaca y Nature conservation officer transpirar (ejercicio de intensidad moderada). Hacer ejercicios de fortalecimiento por lo [employer] por semana. Agregue esto a su plan de ejercicio de intensidad moderada. Pasar menos tiempo sentados. Incluso la actividad fsica ligera puede ser beneficiosa. Controle sus niveles de colesterol y lpidos en la sangre Comience a realizarse anlisis de lpidos y colesterol en la sangre a los20 aos y luego reptalos cada 5 aos. Hgase controlar los niveles de colesterol con mayor frecuencia si: Sus niveles de lpidos y colesterol son altos. Es mayor de 67 aos. Presenta un alto riesgo de padecer enfermedades cardacas. Qu debo saber sobre las pruebas de deteccin del cncer? Segn su historia clnica y sus antecedentes familiares, es posible que deba realizarse pruebas de deteccin del cncer en diferentes edades. Esto puede incluir pruebas de deteccin de lo siguiente: Cncer de mama. Cncer de cuello uterino. Cncer colorrectal. Cncer de piel. Cncer de pulmn. Qu debo saber sobre la enfermedad cardaca, la diabetes y la hipertensinarterial? Presin arterial y enfermedad cardaca La hipertensin arterial causa enfermedades cardacas y [place] el riesgo de accidente cerebrovascular. Es ms probable que esto se manifieste en las personas que tienen lecturas de presin arterial alta, tienen ascendencia africana o tienen sobrepeso. Hgase controlar la presin arterial: Cada 3 a 5 aos si tiene entre 18 y 78 aos. Todos los aos si es mayor de 40 aos. Diabetes Realcese exmenes de deteccin de la diabetes con regularidad. Este anlisis revisa el nivel de azcar en la sangre en [place]. Hgase las pruebas de deteccin: Cada tres aos despus de los 11 aos de edad si tiene un peso normal y un bajo riesgo de padecer diabetes. Con ms frecuencia y a partir de [place] edad inferior si tiene sobrepeso o un alto riesgo de padecer  diabetes. Qu debo saber sobre la prevencin de infecciones? Hepatitis B Si tiene un riesgo ms alto de contraer hepatitis B, debe someterse a un examen de deteccin de este virus. Hable con el mdico para averiguar si tiene riesgode Emergency planning/management officer  infeccin por hepatitis B. Hepatitis C Se recomienda el anlisis a: [employer] 1945 y 1965. Todas las personas que tengan un riesgo de haber contrado hepatitis C. Enfermedades de transmisin sexual (ETS) Hgase las pruebas de Programme researcher, broadcasting/film/video de ITS, incluidas la gonorrea y la clamidia, si: Es sexualmente activa y es menor de 78 aos. Es mayor de 43 aos, y Investment banker, operational informa que corre riesgo de tener este tipo de infecciones. La actividad sexual ha cambiado desde que le hicieron la ltima prueba de deteccin y tiene un riesgo mayor de Best boy clamidia o Radio broadcast assistant. Pregntele al mdico si usted tiene riesgo. Pregntele al mdico si usted tiene un alto riesgo de Museum/gallery curator VIH. El mdico tambin puede recomendarle un medicamento recetado para ayudar a evitar la infeccin por el VIH. Si elige tomar medicamentos para prevenir el VIH, primero debe [employer] de deteccin del VIH. Luego debe hacerse anlisis cada 3 meses mientras est tomando los medicamentos. Embarazo Si est por dejar de Librarian, academic (fase premenopusica) y usted puede quedar [place], busque asesoramiento antes de [place]. Tome de 400 a 800 microgramos (mcg) de cido [employer] si [place]. Pida mtodos de control de la natalidad (anticonceptivos) si desea evitar un embarazo no deseado. Osteoporosis y [place] La osteoporosis es una enfermedad en la que los huesos pierden los minerales y la fuerza por el avance de la edad. El resultado pueden ser fracturas en los [place]. Si tiene 26 aos o ms, o si est en riesgo de sufrir osteoporosis y fracturas, pregunte a su mdico si debe: Hacerse pruebas de deteccin de prdida sea. Tomar un  suplemento de calcio o de vitamina D para reducir el riesgo de fracturas. Recibir terapia de reemplazo hormonal (TRH) para tratar los sntomas de la menopausia. Siga estas instrucciones en su casa: Estilo de vida No consuma ningn producto que contenga nicotina o tabaco, como cigarrillos, cigarrillos electrnicos y tabaco de Higher education careers adviser. Si necesita ayuda para dejar de fumar, consulte al mdico. No consuma drogas. No comparta agujas. Solicite ayuda a su mdico si necesita apoyo o informacin para abandonar las drogas. Consumo de alcohol No beba alcohol si: Su mdico le indica no hacerlo. Est embarazada, puede estar embarazada o est tratando de [place]. Si bebe alcohol: Limite la cantidad que consume de 0 a 1 medida por da. Limite la ingesta si est amamantando. Est atento a la cantidad de alcohol que hay en las bebidas que toma. En los [place], una medida equivale a una botella de cerveza de 12 oz (355 ml), un vaso de vino de 5 oz (148 ml) o un vaso de una bebida alcohlica de alta graduacin de 1 oz (44 ml). Instrucciones generales Realcese los estudios de rutina de la salud, dentales y de Public librarian. [place]. Infrmele a su mdico si: Se siente deprimida con frecuencia. Alguna vez ha sido vctima de [place] o no se siente segura en su casa. Resumen Adoptar un estilo de vida saludable y recibir atencin preventiva son importantes para promover la salud y Musician. Siga las instrucciones del mdico acerca de una dieta saludable, el ejercicio y la realizacin de pruebas o exmenes para Engineer, building services. Siga las instrucciones del mdico con respecto al control del colesterol y la presin arterial. Esta informacin no tiene Marine scientist el consejo del mdico. Asegresede hacerle al mdico cualquier pregunta que tenga. Document Revised: 02/13/2018 Document Reviewed: 02/13/2018 Elsevier Patient Education  [place].  Agustina Caroli, MD [facility]

## 2020-09-16 ENCOUNTER — Ambulatory Visit: Payer: Self-pay | Admitting: Emergency Medicine

## 2020-12-28 ENCOUNTER — Encounter: Payer: Self-pay | Admitting: Obstetrics and Gynecology

## 2020-12-28 ENCOUNTER — Ambulatory Visit (INDEPENDENT_AMBULATORY_CARE_PROVIDER_SITE_OTHER): Payer: 59 | Admitting: Obstetrics and Gynecology

## 2020-12-28 ENCOUNTER — Other Ambulatory Visit (HOSPITAL_COMMUNITY)
Admission: RE | Admit: 2020-12-28 | Discharge: 2020-12-28 | Disposition: A | Discharge status: Home / Self Care | Payer: 59 | Source: Ambulatory Visit | Attending: Obstetrics and Gynecology | Admitting: Obstetrics and Gynecology

## 2020-12-28 ENCOUNTER — Other Ambulatory Visit: Payer: Self-pay

## 2020-12-28 VITALS — BP 138/80 | HR 78 | Resp 12 | Ht 62.75 in | Wt 164.0 lb

## 2020-12-28 DIAGNOSIS — Z01419 Encounter for gynecological examination (general) (routine) without abnormal findings: Secondary | ICD-10-CM | POA: Diagnosis not present

## 2020-12-28 DIAGNOSIS — Q828 Other specified congenital malformations of skin: Secondary | ICD-10-CM | POA: Diagnosis not present

## 2020-12-28 DIAGNOSIS — D229 Melanocytic nevi, unspecified: Secondary | ICD-10-CM | POA: Diagnosis not present

## 2020-12-28 DIAGNOSIS — Z124 Encounter for screening for malignant neoplasm of cervix: Secondary | ICD-10-CM | POA: Diagnosis present

## 2020-12-28 NOTE — Progress Notes (Signed)
60 y.o. [id number] Single Hispanic female here for annual exam.    Spanish interpretor present today.   Concerned about her skin tags.   No vaginal bleeding or pelvic pain.   She is not sexually active for 11 years.   Takes Alendronate weekly through her PCP.   PCP:   Joellen Jersey, MD  No LMP recorded. Patient has had a hysterectomy.           Sexually active: No.  The current method of family planning is status post hysterectomy.    Exercising: Yes.     Aerobics, walking Smoker:  no  Health Maintenance: Pap:  06-04-17 negative  History of abnormal Pap:  no MMG:  12-10-19 density B/BIRADS 2 benign.  [facility] 2022, normal. Colonoscopy:  2019 per patient- 7 year follow up  BMD:   2019 Result  osteopenia TDaP:  2013 Gardasil:   no HIV: 2019 negative  Hep C: negative  Screening Labs:  Hb today: PCP, Urine today: not collected   reports that she has never smoked. She has never used smokeless tobacco. She reports that she does not drink alcohol and does not use drugs.  Past Medical History:  Diagnosis Date   Endometriosis of ovary    Hyperlipidemia    Osteoporosis     Past Surgical History:  Procedure Laterality Date   CYST REMOVAL HAND Bilateral 02/07/2008   OOPHORECTOMY  1995, 1997   right taken out in 1995, left taken out in 1997 b/c of cysts   SUPRACERVICAL ABDOMINAL HYSTERECTOMY  2000    Current Outpatient Medications  Medication Sig Dispense Refill   atorvastatin (LIPITOR) 40 MG tablet Take 1 tablet (40 mg total) by mouth daily. 90 tablet 3   Calcium Carb-Cholecalciferol (CALCIUM + D3 PO) Take by mouth daily.     Misc Natural Products (JOINT SUPPORT PO) Take 2 capsules by mouth daily.     Rhubarb (ESTROVEN MENOPAUSE RELIEF PO) Take by mouth.     risedronate (ACTONEL) 35 MG tablet Take 35 mg by mouth every 7 (seven) days. with water on empty stomach, nothing by mouth or lie down for next 30 minutes.     vitamin B-12 (CYANOCOBALAMIN) 100 MCG tablet Take 100 mcg by  mouth daily.     vitamin E 200 UNIT capsule Take 200 Units by mouth daily.     No current facility-administered medications for this visit.    Family History  Problem Relation Age of Onset   Prostate cancer Father    Cervical cancer Mother 59       dec   Colon cancer Neg Hx     Review of Systems  All other systems reviewed and are negative.  Exam:   BP 138/80 (BP Location: Right Arm, Patient Position: Sitting, Cuff Size: Normal)   Pulse 78   Resp 12   Ht 5' 2.75" (1.594 m)   Wt 164 lb (74.4 kg)   BMI 29.28 kg/m     General appearance: alert, cooperative and appears stated age Head: normocephalic, without obvious abnormality, atraumatic Neck: no adenopathy, supple, symmetrical, trachea midline and thyroid normal to inspection and palpation Lungs: clear to auscultation bilaterally Breasts: normal appearance, no masses or tenderness, No nipple retraction or dimpling, No nipple discharge or bleeding, No axillary adenopathy Heart: regular rate and rhythm Abdomen: soft, non-tender; no masses, no organomegaly Extremities: extremities normal, atraumatic, no cyanosis or edema Skin: skin color, texture, turgor normal. Multiple pigmented nevi and skin tags.  Lymph nodes: cervical, supraclavicular, and  axillary nodes normal. Neurologic: grossly normal  Pelvic: External genitalia:  no lesions              No abnormal inguinal nodes palpated.              Urethra:  normal appearing urethra with no masses, tenderness or lesions              Bartholins and Skenes: normal                 Vagina: normal appearing vagina with normal color and discharge, no lesions              Cervix: no lesions              Pap taken: yes Bimanual Exam:  Uterus:  normal size, contour, position, consistency, mobility, non-tender              Adnexa: no mass, fullness, tenderness              Rectal exam: yes.  Confirms.              Anus:  normal sphincter tone, no lesions  Chaperone was present for  exam:  Raquel Sarna, RN  Assessment:   Well woman visit with gynecologic exam. Status post supracervical hysterectomy and bilateral oophorectomy.  FH cervical cancer in mother. Skin tags.  Multiple pigmented nevi. Osteopenia.  On Actonel.   Plan: Mammogram screening discussed.  Will get report from [place].  Self breast awareness reviewed. Pap and HR HPV as above. Guidelines for Calcium, Vitamin D, regular exercise program including cardiovascular and weight bearing exercise. Referral to dermatology.  Follow up annually and prn.   After visit summary provided.

## 2020-12-28 NOTE — Patient Instructions (Signed)

## 2021-01-03 LAB — CYTOLOGY - PAP
Comment: NEGATIVE
Diagnosis: NEGATIVE
High risk HPV: NEGATIVE

## 2021-09-06 ENCOUNTER — Other Ambulatory Visit: Payer: Self-pay | Admitting: Emergency Medicine

## 2021-09-06 ENCOUNTER — Other Ambulatory Visit: Payer: Self-pay | Admitting: Family Medicine

## 2021-09-06 DIAGNOSIS — Z1231 Encounter for screening mammogram for malignant neoplasm of breast: Secondary | ICD-10-CM

## 2021-09-08 ENCOUNTER — Ambulatory Visit (INDEPENDENT_AMBULATORY_CARE_PROVIDER_SITE_OTHER): Payer: 59

## 2021-09-08 DIAGNOSIS — Z1231 Encounter for screening mammogram for malignant neoplasm of breast: Secondary | ICD-10-CM

## 2021-11-03 ENCOUNTER — Other Ambulatory Visit: Payer: Self-pay | Admitting: Emergency Medicine

## 2021-11-03 DIAGNOSIS — E785 Hyperlipidemia, unspecified: Secondary | ICD-10-CM

## 2021-11-22 ENCOUNTER — Ambulatory Visit (INDEPENDENT_AMBULATORY_CARE_PROVIDER_SITE_OTHER): Payer: Commercial Managed Care - HMO | Admitting: Emergency Medicine

## 2021-11-22 ENCOUNTER — Encounter: Payer: Self-pay | Admitting: Emergency Medicine

## 2021-11-22 VITALS — BP 124/72 | HR 65 | Temp 98.1°F | Ht 62.0 in | Wt 155.2 lb

## 2021-11-22 DIAGNOSIS — M8580 Other specified disorders of bone density and structure, unspecified site: Secondary | ICD-10-CM | POA: Diagnosis not present

## 2021-11-22 DIAGNOSIS — E7800 Pure hypercholesterolemia, unspecified: Secondary | ICD-10-CM | POA: Diagnosis not present

## 2021-11-22 NOTE — Assessment & Plan Note (Signed)
Stable.  Diet and nutrition discussed.  Continue atorvastatin 40 mg daily. The 10-year ASCVD risk score (Arnett DK, et al., 2019) is: 2.6%   Values used to calculate the score:     Age: 61 years     Sex: Female     Is Non-Hispanic African American: No     Diabetic: No     Tobacco smoker: No     Systolic Blood Pressure: 244 mmHg     Is BP treated: No     HDL Cholesterol: 73.8 mg/dL     Total Cholesterol: 173 mg/dL

## 2021-11-22 NOTE — Patient Instructions (Signed)

## 2021-11-22 NOTE — Assessment & Plan Note (Signed)
Needs repeat DEXA scan. Stop osteoporosis medication until test is done.

## 2021-11-22 NOTE — Progress Notes (Signed)
Patricia Mitchell 61 y.o.   Chief Complaint  Patient presents with   Follow-up    F/u appt    HISTORY OF PRESENT ILLNESS: This is a 61 y.o. female here for follow-up. Last visit with me over a year ago. Recently seen at a different clinic for annual exam.  States she had blood work done. States she has a history of osteopenia and takes weekly alendronate. History of dyslipidemia on atorvastatin.  HPI   Prior to Admission medications   Medication Sig Start Date End Date Taking? Authorizing Provider  alendronate (FOSAMAX) 35 MG tablet Take 1 tablet by mouth once a week.   Yes [provider]  atorvastatin (LIPITOR) 40 MG tablet Take 1 tablet by mouth once daily 11/03/21  Yes Ronita Hargreaves, Ines Bloomer, MD  Calcium Carb-Cholecalciferol (CALCIUM + D3 PO) Take by mouth daily.   Yes [provider]  Misc Natural Products (JOINT SUPPORT PO) Take 2 capsules by mouth daily.   Yes [provider]  Rhubarb (ESTROVEN MENOPAUSE RELIEF PO) Take by mouth.   Yes [provider]  risedronate (ACTONEL) 35 MG tablet Take 35 mg by mouth every 7 (seven) days. with water on empty stomach, nothing by mouth or lie down for next 30 minutes.   Yes [provider]  vitamin B-12 (CYANOCOBALAMIN) 100 MCG tablet Take 100 mcg by mouth daily.   Yes [provider]  vitamin E 200 UNIT capsule Take 200 Units by mouth daily.   Yes [provider]  fluticasone (FLONASE) 50 MCG/ACT nasal spray Place 2 sprays into both nostrils daily. 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   Hyperlipidemia 07/11/2011   Constipation 07/11/2011   Well woman exam 07/11/2011    Past Medical History:  Diagnosis Date   Endometriosis of ovary    Hyperlipidemia    Osteoporosis     Past Surgical History:   Procedure Laterality Date   CYST REMOVAL HAND Bilateral 02/07/2008   OOPHORECTOMY  1995, 1997   right taken out in 1995, left taken out in 1997 b/c of cysts   SUPRACERVICAL ABDOMINAL HYSTERECTOMY  2000    Social History   Socioeconomic History   Marital status: Single    Spouse name: Not on file   Number of children: Not on file   Years of education: Not on file   Highest education level: Not on file  Occupational History   Not on file  Tobacco Use   Smoking status: Never   Smokeless tobacco: Never  Vaping Use   Vaping Use: Never used  Substance and Sexual Activity   Alcohol use: No   Drug use: No   Sexual activity: Never    Birth control/protection: Surgical    Comment: Hyst  Other Topics Concern   Not on file  Social History Narrative   Spanish speaking   [name] about [place] from a family member   Lives at home with 42 yo brother      Last mammogram in 1996 (normal)   Last PAP in 2002 (normal)   Social Determinants of Health   Financial Resource Strain: Not on file  Food Insecurity: Not on file  Transportation Needs: Not on file  Physical Activity: Not on file  Stress: Not on file  Social Connections: Not on file  Intimate Partner Violence:  Not on file    Family History  Problem Relation Age of Onset   Prostate cancer Father    Cervical cancer Mother 21       dec   Colon cancer Neg Hx      Review of Systems  Constitutional: Negative.  Negative for chills and fever.  HENT: Negative.  Negative for congestion and sore throat.   Respiratory: Negative.  Negative for cough and shortness of breath.   Cardiovascular: Negative.  Negative for chest pain and palpitations.  Gastrointestinal:  Negative for nausea and vomiting.  Genitourinary: Negative.   Skin: Negative.  Negative for rash.  Neurological:  Negative for dizziness and headaches.  All other systems reviewed and are negative.  Today's Vitals   11/22/21 1525  BP: 124/72  Pulse: 65  Temp: 98.1 F  (36.7 C)  TempSrc: Oral  SpO2: 97%  Weight: 155 lb 4 oz (70.4 kg)  Height: '5\' 2"'$  (1.575 m)   Body mass index is 28.4 kg/m.   Physical Exam Vitals reviewed.  Constitutional:      Appearance: Normal appearance.  HENT:     Head: Normocephalic.     Mouth/Throat:     Mouth: Mucous membranes are moist.     Pharynx: Oropharynx is clear.  Eyes:     Extraocular Movements: Extraocular movements intact.     Conjunctiva/sclera: Conjunctivae normal.     Pupils: Pupils are equal, round, and reactive to light.  Cardiovascular:     Rate and Rhythm: Normal rate and regular rhythm.     Pulses: Normal pulses.     Heart sounds: Normal heart sounds.  Pulmonary:     Effort: Pulmonary effort is normal.     Breath sounds: Normal breath sounds.  Abdominal:     Palpations: Abdomen is soft.     Tenderness: There is no abdominal tenderness.  Musculoskeletal:     Cervical back: No tenderness.     Right lower leg: No edema.     Left lower leg: No edema.  Lymphadenopathy:     Cervical: No cervical adenopathy.  Skin:    General: Skin is warm and dry.  Neurological:     General: No focal deficit present.     Mental Status: She is alert and oriented to person, place, and time.  Psychiatric:        Mood and Affect: Mood normal.        Behavior: Behavior normal.      ASSESSMENT & PLAN: A total of 42 minutes was spent with the patient and counseling/coordination of care regarding preparing for this visit, review of most recent office visit notes, review of most recent blood work results, review of multiple chronic medical problems and their management, review of all medications, education on nutrition, cardiovascular risks associated with dyslipidemia, prognosis, documentation, and need for follow-up..  Problem List Items Addressed This Visit       Musculoskeletal and Integument   Osteopenia - Primary    Needs repeat DEXA scan. Stop osteoporosis medication until test is done.      Relevant  Orders   HM DEXA SCAN (Completed)     Other   Hyperlipidemia    Stable.  Diet and nutrition discussed.  Continue atorvastatin 40 mg daily. The 10-year ASCVD risk score (Arnett DK, et al., 2019) is: 2.6%   Values used to calculate the score:     Age: 15 years     Sex: Female     Is Non-Hispanic African American:  No     Diabetic: No     Tobacco smoker: No     Systolic Blood Pressure: 295 mmHg     Is BP treated: No     HDL Cholesterol: 73.8 mg/dL     Total Cholesterol: 173 mg/dL       Patient Instructions  Mantenimiento de la salud en [place] Maintenance, Female Adoptar un estilo de vida saludable y recibir atencin preventiva son importantes para promover la salud y Musician. Consulte al mdico sobre: El esquema adecuado para hacerse pruebas y exmenes peridicos. Cosas que puede hacer por su cuenta para prevenir enfermedades y [place] sano. Qu debo saber sobre la dieta, el peso y el ejercicio? Consuma una dieta saludable  Consuma una dieta que incluya muchas verduras, frutas, productos lcteos con bajo contenido de [place] y Advertising account planner. No consuma muchos alimentos ricos en grasas slidas, azcares agregados o sodio. Mantenga un peso saludable El ndice de masa muscular [facility]) se [place] para identificar problemas de [place]. Proporciona una estimacin de la grasa corporal basndose en el peso y la altura. Su mdico puede ayudarle a Radiation protection practitioner [place] y a Scientist, forensic o Theatre manager un peso saludable. Haga ejercicio con regularidad Haga ejercicio con regularidad. Esta es una de las prcticas ms importantes que puede hacer por su salud. La [employer] de los adultos deben seguir estas pautas: Optometrist, al menos, 150 minutos de actividad fsica por semana. El ejercicio debe aumentar la frecuencia cardaca y Nature conservation officer transpirar (ejercicio de intensidad moderada). Hacer ejercicios de fortalecimiento por lo [employer] por semana. Agregue esto a su plan de ejercicio de  intensidad moderada. Pase menos tiempo sentada. Incluso la actividad fsica ligera puede ser beneficiosa. Controle sus niveles de colesterol y lpidos en la sangre Comience a realizarse anlisis de lpidos y Research officer, trade union en la sangre a los 23 aos y luego reptalos cada 5 aos. Hgase controlar los niveles de colesterol con mayor frecuencia si: Sus niveles de lpidos y colesterol son altos. Es mayor de 63 aos. Presenta un alto riesgo de padecer enfermedades cardacas. Qu debo saber sobre las pruebas de deteccin del cncer? Segn su historia clnica y sus antecedentes familiares, es posible que deba realizarse pruebas de deteccin del cncer en diferentes edades. Esto puede incluir pruebas de deteccin de lo siguiente: Cncer de mama. Cncer de cuello uterino. Cncer colorrectal. Cncer de piel. Cncer de pulmn. Qu debo saber sobre la enfermedad cardaca, la diabetes y la hipertensin arterial? Presin arterial y enfermedad cardaca La hipertensin arterial causa enfermedades cardacas y [place] el riesgo de accidente cerebrovascular. Es ms probable que esto se manifieste en las personas que tienen lecturas de presin arterial alta o tienen sobrepeso. Hgase controlar la presin arterial: Cada 3 a 5 aos si tiene entre 18 y 41 aos. Todos los aos si es mayor de 40 aos. Diabetes Realcese exmenes de deteccin de la diabetes con regularidad. Este anlisis revisa el nivel de azcar en la sangre en [place]. Hgase las pruebas de deteccin: Cada tres aos despus de los 4 aos de edad si tiene un peso normal y un bajo riesgo de padecer diabetes. Con ms frecuencia y a partir de [place] edad inferior si tiene sobrepeso o un alto riesgo de padecer diabetes. Qu debo saber sobre la prevencin de infecciones? Hepatitis B Si tiene un riesgo ms alto de contraer hepatitis B, debe someterse a un examen de deteccin de este virus. Hable con el mdico para averiguar si tiene riesgo de Emergency planning/management officer  infeccin  por hepatitis B. Hepatitis C Se recomienda el anlisis a: [employer] 1945 y 1965. Todas las personas que tengan un riesgo de haber contrado hepatitis C. Enfermedades de transmisin sexual (ETS) Hgase las pruebas de Programme researcher, broadcasting/film/video de ITS, incluidas la gonorrea y la clamidia, si: Es sexualmente activa y es menor de 87 aos. Es mayor de 57 aos, y Investment banker, operational informa que corre riesgo de tener este tipo de infecciones. La actividad sexual ha cambiado desde que le hicieron la ltima prueba de deteccin y tiene un riesgo mayor de Best boy clamidia o Radio broadcast assistant. Pregntele al mdico si usted tiene riesgo. Pregntele al mdico si usted tiene un alto riesgo de Museum/gallery curator VIH. El mdico tambin puede recomendarle un medicamento recetado para ayudar a evitar la infeccin por el VIH. Si elige tomar medicamentos para prevenir el VIH, primero debe [employer] de deteccin del VIH. Luego debe hacerse anlisis cada 3 meses mientras est tomando los medicamentos. Embarazo Si est por dejar de Librarian, academic (fase premenopusica) y usted puede quedar [place], busque asesoramiento antes de [place]. Tome de 400 a 800 microgramos (mcg) de cido [employer] si [place]. Pida mtodos de control de la natalidad (anticonceptivos) si desea evitar un embarazo no deseado. Osteoporosis y [place] La osteoporosis es una enfermedad en la que los huesos pierden los minerales y la fuerza por el avance de la edad. El resultado pueden ser fracturas en los [place]. Si tiene 41 aos o ms, o si est en riesgo de sufrir osteoporosis y fracturas, pregunte a su mdico si debe: Hacerse pruebas de deteccin de prdida sea. Tomar un suplemento de calcio o de vitamina D para reducir el riesgo de fracturas. Recibir terapia de reemplazo hormonal (TRH) para tratar los sntomas de la menopausia. Siga estas indicaciones en su casa: Consumo de alcohol No beba alcohol si: Su mdico le  indica no hacerlo. Est embarazada, puede estar embarazada o est tratando de [place]. Si bebe alcohol: Limite la cantidad que bebe a lo siguiente: De 0 a 1 bebida por da. Sepa cunta cantidad de alcohol hay en las bebidas que toma. En los [place], una medida equivale a una botella de cerveza de 12 oz (355 ml), un vaso de vino de 5 oz (148 ml) o un vaso de una bebida alcohlica de alta graduacin de 1 oz (44 ml). Estilo de vida No consuma ningn producto que contenga nicotina o tabaco. Estos productos incluyen cigarrillos, tabaco para Higher education careers adviser y aparatos de vapeo, como los Psychologist, sport and exercise. Si necesita ayuda para dejar de consumir estos productos, consulte al mdico. No consuma drogas. No comparta agujas. Solicite ayuda a su mdico si necesita apoyo o informacin para abandonar las drogas. Indicaciones generales Realcese los estudios de rutina de la salud, dentales y de Public librarian. [place]. Infrmele a su mdico si: Se siente deprimida con frecuencia. Alguna vez ha sido vctima de [place] o no se siente seguro en su casa. Resumen Adoptar un estilo de vida saludable y recibir atencin preventiva son importantes para promover la salud y Musician. Siga las instrucciones del mdico acerca de una dieta saludable, el ejercicio y la realizacin de pruebas o exmenes para Engineer, building services. Siga las instrucciones del mdico con respecto al control del colesterol y la presin arterial. Esta informacin no tiene Marine scientist el consejo del mdico. Asegrese de hacerle al mdico cualquier pregunta que tenga. Document Revised: 07/01/2020 Document Reviewed: 07/01/2020 Elsevier Patient Education  [place], MD [facility]

## 2021-11-24 ENCOUNTER — Other Ambulatory Visit: Payer: Self-pay | Admitting: *Deleted

## 2021-11-24 DIAGNOSIS — M8580 Other specified disorders of bone density and structure, unspecified site: Secondary | ICD-10-CM

## 2021-11-30 ENCOUNTER — Ambulatory Visit (INDEPENDENT_AMBULATORY_CARE_PROVIDER_SITE_OTHER)
Admission: RE | Admit: 2021-11-30 | Discharge: 2021-11-30 | Disposition: A | Discharge status: Home / Self Care | Payer: Commercial Managed Care - HMO | Source: Ambulatory Visit | Attending: Emergency Medicine | Admitting: Emergency Medicine

## 2021-11-30 DIAGNOSIS — M8580 Other specified disorders of bone density and structure, unspecified site: Secondary | ICD-10-CM

## 2021-12-24 ENCOUNTER — Ambulatory Visit (INDEPENDENT_AMBULATORY_CARE_PROVIDER_SITE_OTHER): Payer: Commercial Managed Care - HMO

## 2021-12-24 ENCOUNTER — Ambulatory Visit (HOSPITAL_COMMUNITY)
Admission: EM | Admit: 2021-12-24 | Discharge: 2021-12-24 | Disposition: A | Discharge status: Home / Self Care | Payer: Commercial Managed Care - HMO | Attending: Physician Assistant | Admitting: Physician Assistant

## 2021-12-24 ENCOUNTER — Encounter (HOSPITAL_COMMUNITY): Payer: Self-pay

## 2021-12-24 DIAGNOSIS — Z20822 Contact with and (suspected) exposure to covid-19: Secondary | ICD-10-CM | POA: Diagnosis not present

## 2021-12-24 DIAGNOSIS — J069 Acute upper respiratory infection, unspecified: Secondary | ICD-10-CM | POA: Diagnosis not present

## 2021-12-24 DIAGNOSIS — R059 Cough, unspecified: Secondary | ICD-10-CM

## 2021-12-24 DIAGNOSIS — R051 Acute cough: Secondary | ICD-10-CM

## 2021-12-24 DIAGNOSIS — J029 Acute pharyngitis, unspecified: Secondary | ICD-10-CM | POA: Diagnosis present

## 2021-12-24 DIAGNOSIS — R0981 Nasal congestion: Secondary | ICD-10-CM

## 2021-12-24 LAB — RESP PANEL BY RT-PCR (FLU A&B, COVID) ARPGX2
Influenza A by PCR: POSITIVE — AB
Influenza B by PCR: NEGATIVE
SARS Coronavirus 2 by RT PCR: NEGATIVE

## 2021-12-24 MED ORDER — PROMETHAZINE-DM 6.25-15 MG/5ML PO SYRP: 5.0000 mL | ORAL_SOLUTION | Freq: Two times a day (BID) | ORAL | 0 refills | Status: DC | PRN

## 2021-12-24 MED ORDER — PREDNISONE 20 MG PO TABS: 40.0000 mg | ORAL_TABLET | Freq: Every day | ORAL | 0 refills | Status: AC

## 2021-12-24 MED ORDER — FLUTICASONE PROPIONATE 50 MCG/ACT NA SUSP: 1.0000 | Freq: Every day | NASAL | 0 refills | Status: DC

## 2021-12-24 MED ORDER — ALBUTEROL SULFATE HFA 108 (90 BASE) MCG/ACT IN AERS: 1.0000 | INHALATION_SPRAY | Freq: Four times a day (QID) | RESPIRATORY_TRACT | 0 refills | Status: DC | PRN

## 2021-12-24 NOTE — ED Triage Notes (Signed)
Pt complains of sore throat, fever, headache, body aches, chills, cough, bilateral ear pain, nasal congestion , runny nose , fatigue  dizziness, and loss of appetite stomach pain x5 days

## 2021-12-24 NOTE — ED Provider Notes (Signed)
[place]    CSN: [id number] Arrival date & time: 12/24/21  1002      History   Chief Complaint Chief Complaint  Patient presents with   Fever   Cough   Headache   Sore Throat   Otalgia    HPI Patricia Mitchell is a 61 y.o. female.   Patient presents today with a 4 to 5-day history of URI symptoms including sore throat, fever, headache, nasal congestion, productive cough, body aches, chills, fatigue, malaise.  Patient is Spanish-speaking and video interpreter was utilized during visit.  Denies any shortness of breath, ongoing chest pain, nausea, vomiting, diarrhea.  She does report some chest discomfort with coughing.  She has tried an over-the-counter medication from [place] but does not remember the name of this.  She has not tried any over-the-counter medications such as Tylenol, ibuprofen, multisymptom medication.  She denies history of diabetes, asthma, COPD, smoking, immunosuppression.  She has not had COVID in the past.  She has had COVID-19 vaccines.  Does report steroid use approximately 6 months ago but denies any recent antibiotics.  She reports symptoms are worsening prompting evaluation today.    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 Authorizing Provider  albuterol (VENTOLIN HFA) 108 (90 Base) MCG/ACT inhaler Inhale 1-2 puffs into the lungs every 6 (six) hours as needed for wheezing or shortness of breath. 12/24/21  Yes Jhordan Kinter K, PA-C  fluticasone (FLONASE) 50 MCG/ACT nasal spray Place 1 spray into both nostrils daily. 12/24/21  Yes Emersen Mascari K, PA-C  predniSONE (DELTASONE) 20 MG tablet Take 2 tablets (40 mg total) by mouth daily for 4 days. 12/24/21 12/28/21 Yes Rajah Tagliaferro K, PA-C  promethazine-dextromethorphan (PROMETHAZINE-DM) 6.25-15 MG/5ML syrup Take 5 mLs by mouth 2 (two) times daily as needed for cough. 12/24/21  Yes Rahim Astorga K, PA-C  alendronate (FOSAMAX) 35 MG tablet Take 1 tablet by mouth once a week.    [provider]  atorvastatin (LIPITOR) 40 MG tablet Take 1 tablet by mouth once daily 11/03/21   Horald Pollen, MD  Calcium Carb-Cholecalciferol (CALCIUM + D3 PO) Take by mouth daily.    [provider]  Misc Natural Products (JOINT SUPPORT PO) Take 2 capsules by mouth daily.    [provider]  Rhubarb (ESTROVEN MENOPAUSE RELIEF PO) Take by mouth.    [provider]  risedronate (ACTONEL) 35 MG tablet Take 35 mg by mouth every 7 (seven) days. with water on empty stomach, nothing by mouth or lie down for next 30 minutes.    [provider]  vitamin B-12 (CYANOCOBALAMIN) 100 MCG tablet Take 100 mcg by mouth daily.    [provider]  vitamin E 200 UNIT capsule Take 200 Units by mouth daily.    [provider]    Family History Family History  Problem Relation Age of Onset   Prostate cancer Father    Cervical cancer Mother 54       dec   Colon cancer Neg Hx     Social History Social History   Tobacco Use   Smoking status: Never   Smokeless tobacco: Never  Vaping Use   Vaping Use: Never used  Substance Use Topics   Alcohol use:  No   Drug use: No     Allergies   Patient has no known allergies.   Review of Systems Review of Systems  Constitutional:  Positive for activity change, appetite change, chills, fatigue and fever.  HENT:  Positive for congestion, postnasal drip, sinus pressure and sore throat. Negative for sneezing.   Respiratory:  Positive for cough and chest tightness. Negative for shortness of breath.   Cardiovascular:  Negative for chest pain.  Gastrointestinal:  Negative for abdominal pain, diarrhea, nausea and vomiting.  Musculoskeletal:  Positive for arthralgias and myalgias.  Neurological:  Positive for headaches. Negative for dizziness and light-headedness.     Physical Exam Triage Vital Signs ED Triage Vitals  Enc Vitals Group     BP 12/24/21 1018 114/65     Pulse Rate 12/24/21 1018 (!) 109     Resp 12/24/21 1018 16     Temp 12/24/21 1018 99.2 F (37.3 C)     Temp Source 12/24/21 1018 Oral     SpO2 12/24/21 1018 95 %     Weight --      Height --      Head Circumference --      Peak Flow --      Pain Score 12/24/21 1016 7     Pain Loc --      Pain Edu? --      Excl. in [place]? --    No data found.  Updated Vital Signs BP 114/65 (BP Location: Left Arm)   Pulse 94   Temp 99.2 F (37.3 C) (Oral)   Resp 16   SpO2 96%   Visual Acuity Right Eye Distance:   Left Eye Distance:   Bilateral Distance:    Right Eye Near:   Left Eye Near:    Bilateral Near:     Physical Exam Vitals reviewed.  Constitutional:      General: She is awake. She is not in acute distress.    Appearance: Normal appearance. She is well-developed. She is not ill-appearing.     Comments: Very pleasant female appears stated age in no acute distress sitting comfortably in exam room  HENT:     Head: Normocephalic and atraumatic.     Right Ear: Tympanic membrane, ear canal and external ear normal. Tympanic membrane is not erythematous or bulging.     Left Ear: Tympanic membrane, ear canal and external ear  normal. Tympanic membrane is not erythematous or bulging.     Nose:     Right Sinus: Maxillary sinus tenderness and frontal sinus tenderness present.     Left Sinus: Maxillary sinus  tenderness and frontal sinus tenderness present.     Mouth/Throat:     Pharynx: Uvula midline. Posterior oropharyngeal erythema present. No oropharyngeal exudate.  Cardiovascular:     Rate and Rhythm: Normal rate and regular rhythm.     Heart sounds: Normal heart sounds, S1 normal and S2 normal. No murmur heard. Pulmonary:     Effort: Pulmonary effort is normal.     Breath sounds: Examination of the right-lower field reveals decreased breath sounds. Examination of the left-lower field reveals decreased breath sounds. Decreased breath sounds present. No wheezing, rhonchi or rales.     Comments: Decreased aeration bilateral bases Psychiatric:        Behavior: Behavior is cooperative.      UC Treatments / Results  Labs (all labs ordered are listed, but only abnormal results are displayed) Labs Reviewed  RESP PANEL BY RT-PCR (FLU A&B, COVID) [id number]    EKG   Radiology DG Chest 2 View  Result Date: 12/24/2021 CLINICAL DATA:  Progressive cough. EXAM: CHEST - 2 VIEW COMPARISON:  Two-view chest x-ray 04/19/2016 FINDINGS: Heart size is normal. Mild hilar prominence is present. Lung volumes are low. No focal airspace disease is present. No edema or effusions are present. A remote T7 fracture is noted. Visualized soft tissues and bony thorax are otherwise unremarkable. IMPRESSION: 1. Low lung volumes. 2. No acute cardiopulmonary disease. 3. Remote T7 fracture. Electronically Signed   By: San Morelle M.D.   On: 12/24/2021 11:01    Procedures Procedures (including critical care time)  Medications Ordered in UC Medications - No data to display  Initial Impression / Assessment and Plan / UC Course  I have reviewed the triage vital signs and the nursing notes.  Pertinent labs & imaging results that  were available during my care of the patient were reviewed by me and considered in my medical decision making (see chart for details).     Patient is well-appearing, afebrile, nontoxic, nontachycardic.  X-ray was obtained given decreased aeration bilateral bases which showed low lung volumes but no acute cardiopulmonary disease.  Remote T7 fracture was noted which patient was unaware of but denies any ongoing back pain.  No evidence of acute infection on physical exam that warrant initiation of antibiotics.  Flu and COVID testing were obtained today.  We will contact her if this is positive but she is aware that we will not call her if they are negative.  We discussed return to work guidelines based on COVID test result.  She was provided work excuse note with this information.  We will treat symptomatically with albuterol, prednisone burst of 40 mg/day for 4 days, Promethazine DM, Flonase.  She can use Mucinex and Tylenol over-the-counter.  She was instructed avoid NSAIDs with prednisone and we discussed that Promethazine DM can be sedating.  She is to rest and drink plenty of fluid.  Discussed that if her symptoms are not improving by next week she should return for reevaluation.  If at any point she has worsening symptoms including high fever, worsening cough, shortness of breath, chest pain, nausea/vomiting interfere with oral intake, weakness she needs to go to the emergency room immediately to which she expressed understanding.  Strict return precautions given.  Final Clinical Impressions(s) / UC Diagnoses   Final diagnoses:  Upper respiratory tract infection, unspecified type  Acute cough  Nasal congestion     Discharge Instructions      We will call you if your COVID/flu test is positive.  Use  Promethazine DM for cough.  This make you sleepy so do not drive or drink alcohol while taking it.  Take prednisone as prescribed.  Do not take NSAIDs with this medication including aspirin,  ibuprofen/Advil, naproxen/Aleve.  You can use over-the-counter medications including Mucinex and Tylenol.  Use Flonase for congestion.  Make sure you rest and drink plenty of fluid.  If your symptoms do not improving by next week return for reevaluation.  If you have any worsening symptoms you need to be seen immediately including if you develop any chest pain, shortness of breath, nausea/vomiting interfere with oral intake, weakness.     ED Prescriptions     Medication Sig Dispense Auth. Provider   albuterol (VENTOLIN HFA) 108 (90 Base) MCG/ACT inhaler Inhale 1-2 puffs into the lungs every 6 (six) hours as needed for wheezing or shortness of breath. 18 g Tayli Buch K, PA-C   predniSONE (DELTASONE) 20 MG tablet Take 2 tablets (40 mg total) by mouth daily for 4 days. 8 tablet Khushi Zupko K, PA-C   promethazine-dextromethorphan (PROMETHAZINE-DM) 6.25-15 MG/5ML syrup Take 5 mLs by mouth 2 (two) times daily as needed for cough. 118 mL Mabry Santarelli K, PA-C   fluticasone (FLONASE) 50 MCG/ACT nasal spray Place 1 spray into both nostrils daily. 16 g Lanae Federer K, PA-C      PDMP not reviewed this encounter.   Terrilee Croak, PA-C 12/24/21 1122

## 2021-12-24 NOTE — Discharge Instructions (Signed)
We will call you if your COVID/flu test is positive.  Use Promethazine DM for cough.  This make you sleepy so do not drive or drink alcohol while taking it.  Take prednisone as prescribed.  Do not take NSAIDs with this medication including aspirin, ibuprofen/Advil, naproxen/Aleve.  You can use over-the-counter medications including Mucinex and Tylenol.  Use Flonase for congestion.  Make sure you rest and drink plenty of fluid.  If your symptoms do not improving by next week return for reevaluation.  If you have any worsening symptoms you need to be seen immediately including if you develop any chest pain, shortness of breath, nausea/vomiting interfere with oral intake, weakness.

## 2022-01-24 ENCOUNTER — Encounter: Payer: Self-pay | Admitting: Emergency Medicine

## 2022-01-24 ENCOUNTER — Ambulatory Visit (INDEPENDENT_AMBULATORY_CARE_PROVIDER_SITE_OTHER): Payer: Commercial Managed Care - HMO | Admitting: Emergency Medicine

## 2022-01-24 VITALS — BP 126/76 | HR 69 | Temp 98.2°F | Ht 62.0 in | Wt 155.2 lb

## 2022-01-24 DIAGNOSIS — H259 Unspecified age-related cataract: Secondary | ICD-10-CM | POA: Insufficient documentation

## 2022-01-24 DIAGNOSIS — E7800 Pure hypercholesterolemia, unspecified: Secondary | ICD-10-CM

## 2022-01-24 DIAGNOSIS — M8580 Other specified disorders of bone density and structure, unspecified site: Secondary | ICD-10-CM | POA: Diagnosis not present

## 2022-01-24 DIAGNOSIS — R61 Generalized hyperhidrosis: Secondary | ICD-10-CM

## 2022-01-24 DIAGNOSIS — E8940 Asymptomatic postprocedural ovarian failure: Secondary | ICD-10-CM

## 2022-01-24 NOTE — Assessment & Plan Note (Signed)
No osteoporosis on recent DEXA scan. Continue calcium carb-cholecalciferol daily

## 2022-01-24 NOTE — Patient Instructions (Signed)
Osteopenia Osteopenia  La osteopenia es la prdida de grosor (densidad) en el interior de [place]. Otro nombre para la osteopenia es una masa sea baja. La osteopenia leve es una parte normal del envejecimiento. No es una enfermedad y no causa sntomas. Sin embargo, si tiene osteopenia y contina perdiendo masa sea, puede desarrollar una afeccin que hace que los huesos se vuelvan delgados y se fracturen con ms facilidad (osteoporosis). La osteoporosis puede hacer que pierda algo de altura, tenga dolor de espalda y [place] encorvada. Aunque la osteopenia no es una enfermedad, realizar cambios en el estilo de vida y la dieta puede ayudar a evitar que la osteopenia se convierta en osteoporosis. Cules son las causas? La osteopenia es causada por la prdida de [employer]. Los huesos estn constantemente cambiando. Las clulas seas viejas se reemplazan continuamente por clulas seas nuevas. Este proceso genera hueso nuevo. El mineral calcio es necesario para fabricar hueso nuevo y Theatre manager la densidad sea. La densidad sea generalmente se encuentra en su punto ms alto alrededor [employer] 89 aos. Despus de eso, el cuerpo de la [employer] de las personas reemplaza todo hueso que han perdido con hueso nuevo. Qu incrementa el riesgo? Es ms probable que sufra esta afeccin si: Es mayor de 24 aos. Es Musician que entr en la menopusica de [place] temprana. Tiene una enfermedad prolongada que la mantiene en cama. No realiza suficiente actividad fsica. Le faltan ciertos nutrientes (malnutricin). Tiene hiperactividad tiroidea (hipertiroidismo). [place] algn producto que contiene nicotina o tabaco, como cigarrillos, cigarrillos electrnicos y tabaco de Higher education careers adviser, o bebe mucho alcohol. Toma medicamentos que [employer], como los corticoesteroides. Cules son los signos o sntomas? Esta afeccin no causa ningn sntoma. Puede tener un riesgo levemente ms alto de rotura de huesos  (fracturas), de modo que sufrir fracturas ms fcilmente que lo normal puede ser un indicio de osteopenia. Cmo se diagnostica? Esta afeccin puede diagnosticarse basndose en un examen radiolgico que mida la densidad sea (absorciometra de rayos X de energa dual, o DEXA). Esta prueba puede medir la densidad sea en la cadera, la columna vertebral y las muecas. La osteopenia no presenta sntomas, de modo que esta afeccin se diagnostica generalmente despus de una prueba de deteccin de rutina de la densidad sea para la osteoporosis. Este estudio de deteccin de rutina se realiza generalmente en: Mujeres a partir de los 21 aos. Hombres a partir de los 21 aos. Si tiene factores de riesgo de sufrir osteopenia, puede someterse a la prueba de deteccin a una edad ms temprana. Cmo se trata? Hacer cambios en la dieta y el estilo de vida puede disminuir el riesgo de padecer osteoporosis. Si tiene osteopenia grave que est cerca de convertirse en osteoporosis, esta afeccin puede tratarse con medicamentos y suplementos nutricionales tales como calcio y vitamina D. Estos suplementos ayudan a recuperar la densidad sea. Siga estas instrucciones en su casa: Comida y bebida Consuma una dieta rica en calcio y vitamina D. El calcio se encuentra en los productos lcteos, los frijoles, el salmn y las verduras de hoja verde, como la espinaca y el brcoli. Busque alimentos que contengan vitamina D y calcio agregados (alimentos fortificados), como jugo de [place], cereales, y pan.  Estilo de vida Haga 30 minutos o ms de una actividad fsica con soporte de [employer], Administrator, sports, trotar o Automotive engineer. Estos tipos de [employer]. No consuma ningn producto que contenga nicotina o tabaco, como cigarrillos, cigarrillos  electrnicos y tabaco de Higher education careers adviser. Si necesita ayuda para dejar de consumir estos productos, consulte al mdico. No beba alcohol si: Su mdico le  indica no hacerlo. Est embarazada, puede estar embarazada o est tratando de [place]. Si bebe alcohol: Limite la cantidad que bebe: De 0 a 1 medida por da para las mujeres. De 0 a 2 medidas por da para los hombres. Est atento a la cantidad de alcohol que hay en las bebidas que toma. En los [place], una medida equivale a una botella de cerveza de 12 oz (355 ml), un vaso de vino de 5 oz (148 ml) o un vaso de una bebida alcohlica de alta graduacin de 1 oz (44 ml). Indicaciones generales Use los medicamentos de venta libre y los recetados solamente como se lo haya indicado el mdico. Estos [place] y los suplementos. Tome las precauciones necesarias en su casa para reducir el riesgo de cadas, como: Mantener las habitaciones bien iluminadas y libres de obstculos como cables. Instalar barandas de seguridad en las escaleras. Usar tapetes de caucho en el bao y en otros sectores que a menudo estn mojados o son resbaladizos. Cumpla con todas las visitas de seguimiento. Esto es importante. Comunquese con un mdico si: No se ha realizado una prueba de deteccin de la densidad sea para la osteoporosis y usted: Dalene Seltzer mayor de 62 aos de edad. Un hombre mayor de 51 aos de edad. Es una mujer posmenopusica que no se ha sometido a una prueba de deteccin de la densidad sea para la osteoporosis. Tiene ms de 24 aos y desea saber si debe someterse a una prueba de deteccin de la densidad sea para la osteoporosis. Resumen La osteopenia es la prdida de grosor (densidad) en el interior de [place]. Otro nombre para la osteopenia es una masa sea baja. La osteopenia no es [place] enfermedad, pero puede aumentar el riesgo de Actor una afeccin que hace que los huesos se vuelvan delgados y se fracturen con ms facilidad (osteoporosis). Puede estar en riesgo de padecer osteopenia si es mayor de 23 aos o si es una mujer que comenz la menopausia de [place]  temprana. La osteopenia no causa sntomas, pero puede diagnosticarse con una prueba de deteccin de la densidad sea. Los [employer] dieta y el estilo de vida son Software engineer tratamiento para osteopenia. Estos pueden reducir Catering manager de osteoporosis, Esta informacin no tiene Marine scientist el consejo del mdico. Asegrese de hacerle al mdico cualquier pregunta que tenga. Document Revised: 08/26/2019 Document Reviewed: 08/26/2019 Elsevier Patient Education  [place].

## 2022-01-24 NOTE — Assessment & Plan Note (Signed)
Secondary to surgical early menopause

## 2022-01-24 NOTE — Assessment & Plan Note (Addendum)
Stable.  Diet and nutrition discussed.  Continue atorvastatin 40 mg daily. The 10-year ASCVD risk score (Arnett DK, et al., 2019) is: 2.7%   Values used to calculate the score:     Age: 61 years     Sex: Female     Is Non-Hispanic African American: No     Diabetic: No     Tobacco smoker: No     Systolic Blood Pressure: 882 mmHg     Is BP treated: No     HDL Cholesterol: 73.8 mg/dL     Total Cholesterol: 173 mg/dL

## 2022-01-24 NOTE — Assessment & Plan Note (Signed)
Starting to affect vision and quality of life. Ophthalmology referral placed today

## 2022-01-24 NOTE — Progress Notes (Signed)
Patricia Mitchell 61 y.o.   Chief Complaint  Patient presents with   Follow-up    Referral to ophthalmology , patient experiencing night sweats     HISTORY OF PRESENT ILLNESS: This is a 61 y.o. female with history of cataracts requesting ophthalmology referral. Also here for results of recent DEXA scan Also still complaining of occasional night sweats for the last 10 years.  Status post total abdominal hysterectomy many years ago with early menopause. No other complaints or medical concerns today.  HPI   Prior to Admission medications   Medication Sig Start Date End Date Taking? Authorizing Provider  albuterol (VENTOLIN HFA) 108 (90 Base) MCG/ACT inhaler Inhale 1-2 puffs into the lungs every 6 (six) hours as needed for wheezing or shortness of breath. 12/24/21  Yes Raspet, Erin K, PA-C  alendronate (FOSAMAX) 35 MG tablet Take 1 tablet by mouth once a week.   Yes [provider]  atorvastatin (LIPITOR) 40 MG tablet Take 1 tablet by mouth once daily 11/03/21  Yes Caedyn Raygoza, Ines Bloomer, MD  Calcium Carb-Cholecalciferol (CALCIUM + D3 PO) Take by mouth daily.   Yes [provider]  fluticasone (FLONASE) 50 MCG/ACT nasal spray Place 1 spray into both nostrils daily. 12/24/21  Yes Raspet, Derry Skill, PA-C  Misc Natural Products (JOINT SUPPORT PO) Take 2 capsules by mouth daily.   Yes [provider]  Rhubarb (ESTROVEN MENOPAUSE RELIEF PO) Take by mouth.   Yes [provider]  risedronate (ACTONEL) 35 MG tablet Take 35 mg by mouth every 7 (seven) days. with water on empty stomach, nothing by mouth or lie down for next 30 minutes.   Yes [provider]  vitamin B-12 (CYANOCOBALAMIN) 100 MCG tablet Take 100 mcg by mouth daily.   Yes [provider]  vitamin E 200 UNIT capsule Take 200 Units by mouth daily.   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   Hyperlipidemia 07/11/2011   Constipation 07/11/2011   Well woman exam 07/11/2011    Past Medical History:  Diagnosis Date   Endometriosis of ovary    Hyperlipidemia    Osteoporosis     Past Surgical History:  Procedure Laterality Date   CYST REMOVAL HAND Bilateral 02/07/2008   OOPHORECTOMY  1995, 1997   right taken out in 1995, left taken out in 1997 b/c of cysts   SUPRACERVICAL ABDOMINAL HYSTERECTOMY  2000    Social History   Socioeconomic History   Marital status: Single    Spouse name: Not on file   Number of children: Not on file   Years of education: Not on file   Highest education level: Not on file  Occupational History   Not on file  Tobacco Use   Smoking status: Never   Smokeless tobacco: Never  Vaping Use   Vaping Use: Never used  Substance and Sexual Activity   Alcohol use: No   Drug use: No   Sexual activity: Never    Birth control/protection: Surgical    Comment: Hyst  Other Topics Concern   Not on file  Social History Narrative   Spanish speaking   [name] about [place] from a family member   Lives at home with 85 yo brother      Last mammogram in 1996 (normal)   Last PAP in 2002 (normal)   Social  Determinants of Health   Financial Resource Strain: Not on file  Food Insecurity: Not on file  Transportation Needs: Not on file  Physical Activity: Not on file  Stress: Not on file  Social Connections: Not on file  Intimate Partner Violence: Not on file    Family History  Problem Relation Age of Onset   Prostate cancer Father    Cervical cancer Mother 101       dec   Colon cancer Neg Hx      Review of Systems  Constitutional: Negative.  Negative for chills and fever.  HENT: Negative.  Negative for congestion and sore throat.   Respiratory: Negative.  Negative for cough and shortness of breath.   Cardiovascular: Negative.  Negative for chest pain and palpitations.   Gastrointestinal:  Negative for abdominal pain, diarrhea, nausea and vomiting.  Genitourinary: Negative.   Musculoskeletal: Negative.   Skin: Negative.  Negative for rash.  Neurological: Negative.  Negative for dizziness and headaches.  All other systems reviewed and are negative.  Today's Vitals   01/24/22 1425  BP: 126/76  Pulse: 69  Temp: 98.2 F (36.8 C)  TempSrc: Oral  SpO2: 97%  Weight: 155 lb 4 oz (70.4 kg)  Height: '5\' 2"'$  (1.575 m)   Body mass index is 28.4 kg/m.   Physical Exam Constitutional:      Appearance: Normal appearance.  HENT:     Head: Normocephalic.     Mouth/Throat:     Mouth: Mucous membranes are moist.     Pharynx: Oropharynx is clear.  Eyes:     Extraocular Movements: Extraocular movements intact.     Pupils: Pupils are equal, round, and reactive to light.     Comments: Positive stye left upper eyelid  Cardiovascular:     Rate and Rhythm: Normal rate and regular rhythm.     Pulses: Normal pulses.     Heart sounds: Normal heart sounds.  Pulmonary:     Effort: Pulmonary effort is normal.     Breath sounds: Normal breath sounds.  Musculoskeletal:        General: Normal range of motion.     Cervical back: No tenderness.  Lymphadenopathy:     Cervical: No cervical adenopathy.  Skin:    General: Skin is warm and dry.     Capillary Refill: Capillary refill takes less than 2 seconds.  Neurological:     General: No focal deficit present.     Mental Status: She is alert and oriented to person, place, and time.  Psychiatric:        Mood and Affect: Mood normal.        Behavior: Behavior normal.      ASSESSMENT & PLAN: A total of 43 minutes was spent with the patient and counseling/coordination of care regarding preparing for this visit, review of most recent office visit notes, review of most recent DEXA scan report, review of all medications, review of multiple chronic medical conditions under management, review of all medications, review of  most recent blood work results, education on nutrition, prognosis, documentation, need for follow-up.  Problem List Items Addressed This Visit       Endocrine   Premature surgical menopause    Still having chronic night sweats. Follows up with gynecologist on a regular basis. Presently taking Estroven menopause relief        Musculoskeletal and Integument   Osteopenia - Primary    No osteoporosis on recent DEXA scan. Continue calcium carb-cholecalciferol daily  Other   Hyperlipidemia    Stable.  Diet and nutrition discussed.  Continue atorvastatin 40 mg daily. The 10-year ASCVD risk score (Arnett DK, et al., 2019) is: 2.7%   Values used to calculate the score:     Age: 45 years     Sex: Female     Is Non-Hispanic African American: No     Diabetic: No     Tobacco smoker: No     Systolic Blood Pressure: 384 mmHg     Is BP treated: No     HDL Cholesterol: 73.8 mg/dL     Total Cholesterol: 173 mg/dL       Age-related cataract of both eyes    Starting to affect vision and quality of life. Ophthalmology referral placed today      Relevant Orders   Ambulatory referral to Ophthalmology   Chronic night sweats    Secondary to surgical early menopause      Patient Instructions  Osteopenia Osteopenia  La osteopenia es la prdida de grosor (densidad) en el interior de [place]. Otro nombre para la osteopenia es una masa sea baja. La osteopenia leve es una parte normal del envejecimiento. No es una enfermedad y no causa sntomas. Sin embargo, si tiene osteopenia y contina perdiendo masa sea, puede desarrollar una afeccin que hace que los huesos se vuelvan delgados y se fracturen con ms facilidad (osteoporosis). La osteoporosis puede hacer que pierda algo de altura, tenga dolor de espalda y [place] encorvada. Aunque la osteopenia no es una enfermedad, realizar cambios en el estilo de vida y la dieta puede ayudar a evitar que la osteopenia se convierta en  osteoporosis. Cules son las causas? La osteopenia es causada por la prdida de [employer]. Los huesos estn constantemente cambiando. Las clulas seas viejas se reemplazan continuamente por clulas seas nuevas. Este proceso genera hueso nuevo. El mineral calcio es necesario para fabricar hueso nuevo y Theatre manager la densidad sea. La densidad sea generalmente se encuentra en su punto ms alto alrededor [employer] 84 aos. Despus de eso, el cuerpo de la [employer] de las personas reemplaza todo hueso que han perdido con hueso nuevo. Qu incrementa el riesgo? Es ms probable que sufra esta afeccin si: Es mayor de 32 aos. Es Musician que entr en la menopusica de [place] temprana. Tiene una enfermedad prolongada que la mantiene en cama. No realiza suficiente actividad fsica. Le faltan ciertos nutrientes (malnutricin). Tiene hiperactividad tiroidea (hipertiroidismo). [place] algn producto que contiene nicotina o tabaco, como cigarrillos, cigarrillos electrnicos y tabaco de Higher education careers adviser, o bebe mucho alcohol. Toma medicamentos que [employer], como los corticoesteroides. Cules son los signos o sntomas? Esta afeccin no causa ningn sntoma. Puede tener un riesgo levemente ms alto de rotura de huesos (fracturas), de modo que sufrir fracturas ms fcilmente que lo normal puede ser un indicio de osteopenia. Cmo se diagnostica? Esta afeccin puede diagnosticarse basndose en un examen radiolgico que mida la densidad sea (absorciometra de rayos X de energa dual, o DEXA). Esta prueba puede medir la densidad sea en la cadera, la columna vertebral y las muecas. La osteopenia no presenta sntomas, de modo que esta afeccin se diagnostica generalmente despus de una prueba de deteccin de rutina de la densidad sea para la osteoporosis. Este estudio de deteccin de rutina se realiza generalmente en: Mujeres a partir de los 58 aos. Hombres a partir de los 12 aos. Si tiene factores de  riesgo de sufrir osteopenia, puede someterse a la  prueba de deteccin a una edad ms temprana. Cmo se trata? Hacer cambios en la dieta y el estilo de vida puede disminuir el riesgo de padecer osteoporosis. Si tiene osteopenia grave que est cerca de convertirse en osteoporosis, esta afeccin puede tratarse con medicamentos y suplementos nutricionales tales como calcio y vitamina D. Estos suplementos ayudan a recuperar la densidad sea. Siga estas instrucciones en su casa: Comida y bebida Consuma una dieta rica en calcio y vitamina D. El calcio se encuentra en los productos lcteos, los frijoles, el salmn y las verduras de hoja verde, como la espinaca y el brcoli. Busque alimentos que contengan vitamina D y calcio agregados (alimentos fortificados), como jugo de Marquez, cereales, y pan.  Estilo de vida Haga 30 minutos o ms de una actividad fsica con soporte de [employer], Administrator, sports, trotar o Automotive engineer. Estos tipos de [employer]. No consuma ningn producto que contenga nicotina o tabaco, como cigarrillos, cigarrillos electrnicos y tabaco de Higher education careers adviser. Si necesita ayuda para dejar de consumir estos productos, consulte al mdico. No beba alcohol si: Su mdico le indica no hacerlo. Est embarazada, puede estar embarazada o est tratando de [place]. Si bebe alcohol: Limite la cantidad que bebe: De 0 a 1 medida por da para las mujeres. De 0 a 2 medidas por da para los hombres. Est atento a la cantidad de alcohol que hay en las bebidas que toma. En los [place], una medida equivale a una botella de cerveza de 12 oz (355 ml), un vaso de vino de 5 oz (148 ml) o un vaso de una bebida alcohlica de alta graduacin de 1 oz (44 ml). Indicaciones generales Use los medicamentos de venta libre y los recetados solamente como se lo haya indicado el mdico. Estos [place] y los suplementos. Tome las precauciones necesarias en su  casa para reducir el riesgo de cadas, como: Mantener las habitaciones bien iluminadas y libres de obstculos como cables. Instalar barandas de seguridad en las escaleras. Usar tapetes de caucho en el bao y en otros sectores que a menudo estn mojados o son resbaladizos. Cumpla con todas las visitas de seguimiento. Esto es importante. Comunquese con un mdico si: No se ha realizado una prueba de deteccin de la densidad sea para la osteoporosis y usted: Dalene Seltzer mayor de 53 aos de edad. Un hombre mayor de 42 aos de edad. Es una mujer posmenopusica que no se ha sometido a una prueba de deteccin de la densidad sea para la osteoporosis. Tiene ms de 47 aos y desea saber si debe someterse a una prueba de deteccin de la densidad sea para la osteoporosis. Resumen La osteopenia es la prdida de grosor (densidad) en el interior de [place]. Otro nombre para la osteopenia es una masa sea baja. La osteopenia no es [place] enfermedad, pero puede aumentar el riesgo de Actor una afeccin que hace que los huesos se vuelvan delgados y se fracturen con ms facilidad (osteoporosis). Puede estar en riesgo de padecer osteopenia si es mayor de 19 aos o si es una mujer que comenz la menopausia de [place] temprana. La osteopenia no causa sntomas, pero puede diagnosticarse con una prueba de deteccin de la densidad sea. Los [employer] dieta y el estilo de vida son Software engineer tratamiento para osteopenia. Estos pueden reducir Catering manager de osteoporosis, Esta informacin no tiene Marine scientist el consejo del mdico. Asegrese de hacerle al mdico cualquier pregunta que tenga. Document  Revised: 08/26/2019 Document Reviewed: 08/26/2019 Elsevier Patient Education  [place], MD [facility]

## 2022-01-24 NOTE — Assessment & Plan Note (Signed)
Still having chronic night sweats. Follows up with gynecologist on a regular basis. Presently taking Estroven menopause relief

## 2022-02-04 ENCOUNTER — Encounter (HOSPITAL_COMMUNITY): Payer: Self-pay

## 2022-02-04 ENCOUNTER — Ambulatory Visit (HOSPITAL_COMMUNITY)
Admission: EM | Admit: 2022-02-04 | Discharge: 2022-02-04 | Disposition: A | Discharge status: Home / Self Care | Payer: Commercial Managed Care - HMO | Attending: Emergency Medicine | Admitting: Emergency Medicine

## 2022-02-04 DIAGNOSIS — Z1152 Encounter for screening for COVID-19: Secondary | ICD-10-CM | POA: Insufficient documentation

## 2022-02-04 DIAGNOSIS — M542 Cervicalgia: Secondary | ICD-10-CM

## 2022-02-04 DIAGNOSIS — B349 Viral infection, unspecified: Secondary | ICD-10-CM

## 2022-02-04 DIAGNOSIS — R051 Acute cough: Secondary | ICD-10-CM | POA: Diagnosis present

## 2022-02-04 LAB — SARS CORONAVIRUS 2 (TAT 6-24 HRS): SARS Coronavirus 2: NEGATIVE

## 2022-02-04 MED ORDER — BENZONATATE 100 MG PO CAPS: 100.0000 mg | ORAL_CAPSULE | Freq: Three times a day (TID) | ORAL | 0 refills | Status: DC

## 2022-02-04 MED ORDER — PROMETHAZINE-DM 6.25-15 MG/5ML PO SYRP: 5.0000 mL | ORAL_SOLUTION | Freq: Every evening | ORAL | 0 refills | Status: DC

## 2022-02-04 MED ORDER — DICLOFENAC SODIUM 50 MG PO TBEC: 50.0000 mg | DELAYED_RELEASE_TABLET | Freq: Two times a day (BID) | ORAL | 0 refills | Status: DC

## 2022-02-04 NOTE — ED Triage Notes (Signed)
Pt is here for cough, headache, back pain  sore throat bilateral ear pain, chills, body aches x5days

## 2022-02-04 NOTE — ED Provider Notes (Signed)
[facility]    CSN: [id number] Arrival date & time: 02/04/22  1009      History   Chief Complaint Chief Complaint  Patient presents with   Cough   Headache   Sore Throat    HPI Patricia Mitchell is a 61 y.o. female. Patient presents c/o productive cough with " white to yellow" sputum, generalized body aches, chest pain/back pain with coughing that started 5 days ago.  Patient reports that her brother was sick with an illness of unknown etiology, " has a cough "and she is unsure if maybe she is caught what he had.  Patient reports that she has taken some [place] tea and [place] medicine with no relief of symptoms.  Patient denies any history of cardiovascular problems or history of pneumonia.  Patient reports right-sided neck pain that started 5 months ago.  Patient reports the pain is intermittent.  She denies any fall or trauma.  She denies any increased pain at work or with certain movements.  She reports that the pain is along her clavicular area and will also be present within her neck.  She reports tenderness at times.  She states that she made her primary care doctor aware of it but he did not "do anything about it".  She denies any numbness or tingling in her upper extremities.  She has not taken any medicines for the neck pain.   Spanish interpreter was used for translation.    Cough Associated symptoms: chest pain (with coughing), headaches, rhinorrhea and sore throat (Throat pain at the end of the day due to coughing)   Associated symptoms: no chills, no ear pain, no fever, no shortness of breath and no wheezing   Headache Associated symptoms: back pain (with coughing), cough and sore throat (Throat pain at the end of the day due to coughing)   Associated symptoms: no congestion, no drainage, no ear pain, no fatigue, no fever and no sinus pressure   Sore Throat Associated symptoms include chest pain (with coughing) and headaches. Pertinent negatives include no  shortness of breath.    Past Medical History:  Diagnosis Date   Endometriosis of ovary    Hyperlipidemia    Osteoporosis     Patient Active Problem List   Diagnosis Date Noted   Age-related cataract of both eyes 01/24/2022   Chronic night sweats 01/24/2022   Adenomatous polyp 05/23/2013   Osteopenia 10/19/2011   Premature surgical menopause 10/19/2011   Hyperlipidemia 07/11/2011   Constipation 07/11/2011    Past Surgical History:  Procedure Laterality Date   CYST REMOVAL HAND Bilateral 02/07/2008   OOPHORECTOMY  1995, 1997   right taken out in 1995, left taken out in 1997 b/c of cysts   SUPRACERVICAL ABDOMINAL HYSTERECTOMY  2000    OB History     Gravida  0   Para  0   Term  0   Preterm  0   AB  0   Living  0      SAB  0   IAB  0   Ectopic  0   Multiple  0   Live Births  0            Home Medications    Prior to Admission medications   Medication Sig Start Date End Date Taking? Authorizing Provider  benzonatate (TESSALON) 100 MG capsule Take 1 capsule (100 mg total) by mouth every 8 (eight) hours. 02/04/22  Yes Flossie Dibble, NP  diclofenac (VOLTAREN) 50  MG EC tablet Take 1 tablet (50 mg total) by mouth 2 (two) times daily. 02/04/22  Yes Flossie Dibble, NP  promethazine-dextromethorphan (PROMETHAZINE-DM) 6.25-15 MG/5ML syrup Take 5 mLs by mouth at bedtime. 02/04/22  Yes Flossie Dibble, NP  albuterol (VENTOLIN HFA) 108 (90 Base) MCG/ACT inhaler Inhale 1-2 puffs into the lungs every 6 (six) hours as needed for wheezing or shortness of breath. 12/24/21   Raspet, Derry Skill, PA-C  atorvastatin (LIPITOR) 40 MG tablet Take 1 tablet by mouth once daily 11/03/21   Horald Pollen, MD  Calcium Carb-Cholecalciferol (CALCIUM + D3 PO) Take by mouth daily.    [provider]  fluticasone (FLONASE) 50 MCG/ACT nasal spray Place 1 spray into both nostrils daily. 12/24/21   Raspet, Derry Skill, PA-C  Misc Natural Products (JOINT SUPPORT PO)  Take 2 capsules by mouth daily.    [provider]  Rhubarb (ESTROVEN MENOPAUSE RELIEF PO) Take by mouth.    [provider]  vitamin B-12 (CYANOCOBALAMIN) 100 MCG tablet Take 100 mcg by mouth daily.    [provider]  vitamin E 200 UNIT capsule Take 200 Units by mouth daily.    [provider]    Family History Family History  Problem Relation Age of Onset   Prostate cancer Father    Cervical cancer Mother 41       dec   Colon cancer Neg Hx     Social History Social History   Tobacco Use   Smoking status: Never   Smokeless tobacco: Never  Vaping Use   Vaping Use: Never used  Substance Use Topics   Alcohol use: No   Drug use: No     Allergies   Patient has no known allergies.   Review of Systems Review of Systems  Constitutional:  Negative for activity change, appetite change, chills, fatigue and fever.  HENT:  Positive for rhinorrhea and sore throat (Throat pain at the end of the day due to coughing). Negative for congestion, ear discharge, ear pain, postnasal drip, sinus pressure, sinus pain and trouble swallowing.   Eyes: Negative.   Respiratory:  Positive for cough. Negative for chest tightness, shortness of breath and wheezing.   Cardiovascular:  Positive for chest pain (with coughing).  Gastrointestinal: Negative.   Musculoskeletal:  Positive for back pain (with coughing).  Neurological:  Positive for headaches.     Physical Exam Triage Vital Signs ED Triage Vitals  Enc Vitals Group     BP 02/04/22 1107 114/76     Pulse Rate 02/04/22 1107 71     Resp 02/04/22 1107 12     Temp 02/04/22 1107 98.1 F (36.7 C)     Temp Source 02/04/22 1107 Oral     SpO2 02/04/22 1107 95 %     Weight --      Height --      Head Circumference --      Peak Flow --      Pain Score 02/04/22 1103 8     Pain Loc --      Pain Edu? --      Excl. in [place]? --    No data found.  Updated Vital Signs BP 114/76 (BP Location: Left Arm)   Pulse  71   Temp 98.1 F (36.7 C) (Oral)   Resp 12   SpO2 95%      Physical Exam Vitals and nursing note reviewed.  Constitutional:      Appearance: She is well-developed.  HENT:     Right Ear: Hearing, tympanic membrane, ear canal and external ear normal.     Left Ear: Hearing, tympanic membrane, ear canal and external ear normal.     Nose: Rhinorrhea present. No congestion. Rhinorrhea is clear.     Right Turbinates: Not enlarged, swollen or pale.     Left Turbinates: Not enlarged, swollen or pale.     Mouth/Throat:     Mouth: Mucous membranes are moist.     Pharynx: No pharyngeal swelling, oropharyngeal exudate, posterior oropharyngeal erythema or uvula swelling.     Tonsils: No tonsillar exudate or tonsillar abscesses. 0 on the right. 0 on the left.  Neck:   Cardiovascular:     Rate and Rhythm: Normal rate and regular rhythm.     Heart sounds: Normal heart sounds, S1 normal and S2 normal.  Pulmonary:     Effort: Pulmonary effort is normal.     Breath sounds: Normal breath sounds and air entry. No decreased breath sounds, wheezing, rhonchi or rales.  Musculoskeletal:       Arms:     Cervical back: Full passive range of motion without pain and normal range of motion. No edema, erythema, signs of trauma, rigidity, torticollis or crepitus. Muscular tenderness (Rt sided cervical) present. No pain with movement or spinous process tenderness. Normal range of motion.  Lymphadenopathy:     Cervical: No cervical adenopathy.     Right cervical: No superficial cervical adenopathy.    Left cervical: No superficial cervical adenopathy.  Neurological:     Mental Status: She is alert.      UC Treatments / Results  Labs (all labs ordered are listed, but only abnormal results are displayed) Labs Reviewed  SARS CORONAVIRUS 2 (TAT 6-24 HRS)    EKG   Radiology No results found.  Procedures Procedures (including critical care time)  Medications Ordered in UC Medications - No data to  display  Initial Impression / Assessment and Plan / UC Course  I have reviewed the triage vital signs and the nursing notes.  Pertinent labs & imaging results that were available during my care of the patient were reviewed by me and considered in my medical decision making (see chart for details).     Patient was evaluated for viral illness.  COVID test performed per patient request.  Tessalon and Promethazine DM was sent for symptom management.  Patient was made aware of safety cautions with Promethazine DM.  Patient was made aware of timeline for symptom resolution.  Patient was made aware of results reporting protocol and MyChart.  Patient was evaluated for neck pain.  Etiology of symptoms seems to be related to muscle in the right side cervical region.  Diclofenac prescribed for symptom management and for any inflammation.  Patient was made aware of over-the-counter Salonpas that she can use over the site as well.  She was made aware to follow-up with orthopedist if her symptoms are not improving, she states that she already has an appointment scheduled for her arthritis therefore she will follow-up with the same provider if neck pain is not improving.  Patient verbalized understanding of instructions.   Charting was provided using a a verbal dictation system, charting was proofread for errors, errors may occur which could change the meaning of the information charted.   Final Clinical Impressions(s) / UC Diagnoses   Final diagnoses:  Viral illness  Acute cough  Neck pain, musculoskeletal     Discharge Instructions  For viral symptoms (Para sntomas virales) :  Viral illnesses usually take about 7 to 10 days before symptoms start to resolve.  Please make sure to stay adequately hydrated, drinking at least 8 cups of water daily. Tessalon has been sent to the pharmacy for cough, you can use this medication every 8 hours as needed. Promethazine DM has been sent to the pharmacy, you  can use this medication before bedtime, please do not operate any heavy machinery or drive a car after taking this medication.  Please follow-up if your symptoms or not improving.   en espaol: Las enfermedades virales suelen tardar [employer] [address] y [address] antes de que los sntomas comiencen a [place]. Asegrese de [employer] hidratado y beba al menos 8 vasos de agua al da. Tessalon ha sido enviado a la farmacia por tos, puede usar este medicamento cada 8 horas segn sea necesario. Prometazina DM ha sido enviada a la [place], puede usar este medicamento antes de [place], no opere maquinaria pesada ni conduzca un automvil despus de tomar [employer]. Haga un seguimiento si sus sntomas no mejoran.  For neck pain (para el dolor de cuello):  Medicine has been sent to the pharmacy called diclofenac, this is an anti-inflammatory, you can use this 2 times daily, please make sure to put food on your stomach when taking this medicine. You may also use Salonpas patches over-the-counter, you can purchase this at the pharmacy, this can help to the area.  As discussed, if symptoms are not improving please follow-up with the orthopedist that you are seen for your arthritis.  en espaol:Se envi un medicamento a la farmacia llamado diclofenaco, este es un antiinflamatorio, puede usarlo 2 veces al da, asegrese de poner comida en el estmago cuando tome este medicamento. [employer] parches Salonpas sin receta, puede comprarlos en la farmacia, esto puede ayudar en el rea. Como se mencion anteriormente, si los sntomas no mejoran, haga un seguimiento con el ortopedista que lo atiende por su artritis.     ED Prescriptions     Medication Sig Dispense Auth. Provider   promethazine-dextromethorphan (PROMETHAZINE-DM) 6.25-15 MG/5ML syrup Take 5 mLs by mouth at bedtime. 118 mL Flossie Dibble, NP   benzonatate (TESSALON) 100 MG capsule Take 1 capsule (100 mg total) by mouth  every 8 (eight) hours. 28 capsule Flossie Dibble, NP   diclofenac (VOLTAREN) 50 MG EC tablet Take 1 tablet (50 mg total) by mouth 2 (two) times daily. 30 tablet Flossie Dibble, NP      PDMP not reviewed this encounter.   Flossie Dibble, NP 02/04/22 1435

## 2022-02-04 NOTE — Discharge Instructions (Addendum)
For viral symptoms (Para sntomas virales) :  Viral illnesses usually take about 7 to 10 days before symptoms start to resolve.  Please make sure to stay adequately hydrated, drinking at least 8 cups of water daily. Tessalon has been sent to the pharmacy for cough, you can use this medication every 8 hours as needed. Promethazine DM has been sent to the pharmacy, you can use this medication before bedtime, please do not operate any heavy machinery or drive a car after taking this medication.  Please follow-up if your symptoms or not improving.   en espaol: Las enfermedades virales suelen tardar [employer] [address] y [address] antes de que los sntomas comiencen a [place]. Asegrese de [employer] hidratado y beba al menos 8 vasos de agua al da. Tessalon ha sido enviado a la farmacia por tos, puede usar este medicamento cada 8 horas segn sea necesario. Prometazina DM ha sido enviada a la [place], puede usar este medicamento antes de [place], no opere maquinaria pesada ni conduzca un automvil despus de tomar [employer]. Haga un seguimiento si sus sntomas no mejoran.  For neck pain (para el dolor de cuello):  Medicine has been sent to the pharmacy called diclofenac, this is an anti-inflammatory, you can use this 2 times daily, please make sure to put food on your stomach when taking this medicine. You may also use Salonpas patches over-the-counter, you can purchase this at the pharmacy, this can help to the area.  As discussed, if symptoms are not improving please follow-up with the orthopedist that you are seen for your arthritis.  en espaol:Se envi un medicamento a la farmacia llamado diclofenaco, este es un antiinflamatorio, puede usarlo 2 veces al da, asegrese de poner comida en el estmago cuando tome este medicamento. [employer] parches Salonpas sin receta, puede comprarlos en la farmacia, esto puede ayudar en el rea. Como se mencion anteriormente, si los sntomas  no mejoran, haga un seguimiento con el ortopedista que lo atiende por su artritis.

## 2022-04-02 ENCOUNTER — Encounter (HOSPITAL_COMMUNITY): Payer: Self-pay

## 2022-04-02 ENCOUNTER — Ambulatory Visit (HOSPITAL_COMMUNITY)
Admission: EM | Admit: 2022-04-02 | Discharge: 2022-04-02 | Disposition: A | Discharge status: Home / Self Care | Payer: 59 | Attending: Physician Assistant | Admitting: Physician Assistant

## 2022-04-02 DIAGNOSIS — J0100 Acute maxillary sinusitis, unspecified: Secondary | ICD-10-CM

## 2022-04-02 MED ORDER — AMOXICILLIN-POT CLAVULANATE 875-125 MG PO TABS: 1.0000 | ORAL_TABLET | Freq: Two times a day (BID) | ORAL | 0 refills | Status: DC

## 2022-04-02 MED ORDER — PREDNISONE 20 MG PO TABS: 40.0000 mg | ORAL_TABLET | Freq: Every day | ORAL | 0 refills | Status: AC

## 2022-04-02 NOTE — ED Provider Notes (Signed)
[facility]    CSN: [id number] Arrival date & time: 04/02/22  1009      History   Chief Complaint Chief Complaint  Patient presents with   Cough    HPI Patricia Mitchell is a 62 y.o. female.   Patient presents today with a 7 to 10-day history of worsening URI symptoms.  Patient is Spanish-speaking and video interpreter was utilized during visit.  Reports that her brother and sister-in-law were sick first and then they gave to her.  She has been using multiple over-the-counter medications without improvement of symptoms.  She denies any recent antibiotics or steroids in the past 90 days.  She has not had COVID in the past.  She has a COVID-19 vaccinations.  She denies any history of asthma, COPD, smoking, diabetes.  She is having difficulty sleeping at night as a result of worsening cough.    Past Medical History:  Diagnosis Date   Endometriosis of ovary    Hyperlipidemia    Osteoporosis     Patient Active Problem List   Diagnosis Date Noted   Age-related cataract of both eyes 01/24/2022   Chronic night sweats 01/24/2022   Adenomatous polyp 05/23/2013   Osteopenia 10/19/2011   Premature surgical menopause 10/19/2011   Hyperlipidemia 07/11/2011   Constipation 07/11/2011    Past Surgical History:  Procedure Laterality Date   CYST REMOVAL HAND Bilateral 02/07/2008   OOPHORECTOMY  1995, 1997   right taken out in 1995, left taken out in 1997 b/c of cysts   SUPRACERVICAL ABDOMINAL HYSTERECTOMY  2000    OB History     Gravida  0   Para  0   Term  0   Preterm  0   AB  0   Living  0      SAB  0   IAB  0   Ectopic  0   Multiple  0   Live Births  0            Home Medications    Prior to Admission medications   Medication Sig Start Date End Date Taking? Authorizing Provider  amoxicillin-clavulanate (AUGMENTIN) 875-125 MG tablet Take 1 tablet by mouth every 12 (twelve) hours. 04/02/22  Yes Josiane Labine K, PA-C  Calcium Carb-Cholecalciferol  (CALCIUM + D3 PO) Take by mouth daily.   Yes [provider]  Misc Natural Products (JOINT SUPPORT PO) Take 2 capsules by mouth daily.   Yes [provider]  predniSONE (DELTASONE) 20 MG tablet Take 2 tablets (40 mg total) by mouth daily for 4 days. 04/02/22 04/06/22 Yes Genieve Ramaswamy K, PA-C  vitamin E 200 UNIT capsule Take 200 Units by mouth daily.   Yes [provider]  albuterol (VENTOLIN HFA) 108 (90 Base) MCG/ACT inhaler Inhale 1-2 puffs into the lungs every 6 (six) hours as needed for wheezing or shortness of breath. 12/24/21   Tayven Renteria, Derry Skill, PA-C    Family History Family History  Problem Relation Age of Onset   Prostate cancer Father    Cervical cancer Mother 33       dec   Colon cancer Neg Hx     Social History Social History   Tobacco Use   Smoking status: Never   Smokeless tobacco: Never  Vaping Use   Vaping Use: Never used  Substance Use Topics   Alcohol use: No   Drug use: No     Allergies   Patient has no known allergies.   Review of  Systems Review of Systems  Constitutional:  Positive for activity change. Negative for appetite change, fatigue and fever.  HENT:  Positive for congestion, postnasal drip, sinus pressure and sore throat. Negative for sneezing.   Respiratory:  Positive for cough. Negative for shortness of breath.   Cardiovascular:  Negative for chest pain.  Gastrointestinal:  Negative for abdominal pain, diarrhea, nausea and vomiting.  Neurological:  Positive for headaches. Negative for dizziness and light-headedness.     Physical Exam Triage Vital Signs ED Triage Vitals  Enc Vitals Group     BP 04/02/22 1036 129/79     Pulse Rate 04/02/22 1036 79     Resp 04/02/22 1036 16     Temp 04/02/22 1036 98.5 F (36.9 C)     Temp Source 04/02/22 1036 Oral     SpO2 04/02/22 1036 97 %     Weight 04/02/22 1039 155 lb (70.3 kg)     Height 04/02/22 1039 '5\' 5"'$  (1.651 m)     Head Circumference --      Peak Flow --       Pain Score 04/02/22 1038 8     Pain Loc --      Pain Edu? --      Excl. in [place]? --    No data found.  Updated Vital Signs BP 129/79 (BP Location: Left Arm)   Pulse 79   Temp 98.5 F (36.9 C) (Oral)   Resp 16   Ht '5\' 5"'$  (1.651 m)   Wt 155 lb (70.3 kg)   SpO2 97%   BMI 25.79 kg/m   Visual Acuity Right Eye Distance:   Left Eye Distance:   Bilateral Distance:    Right Eye Near:   Left Eye Near:    Bilateral Near:     Physical Exam Vitals reviewed.  Constitutional:      General: She is awake. She is not in acute distress.    Appearance: Normal appearance. She is well-developed. She is not ill-appearing.     Comments: Very pleasant female appears stated age in no acute distress sitting comfortably in exam room  HENT:     Head: Normocephalic and atraumatic.     Right Ear: Tympanic membrane, ear canal and external ear normal. Tympanic membrane is not erythematous or bulging.     Left Ear: Tympanic membrane, ear canal and external ear normal. Tympanic membrane is not erythematous or bulging.     Nose:     Right Sinus: Maxillary sinus tenderness present. No frontal sinus tenderness.     Left Sinus: Maxillary sinus tenderness present. No frontal sinus tenderness.     Mouth/Throat:     Pharynx: Uvula midline. No oropharyngeal exudate or posterior oropharyngeal erythema.  Cardiovascular:     Rate and Rhythm: Normal rate and regular rhythm.     Heart sounds: Normal heart sounds, S1 normal and S2 normal. No murmur heard. Pulmonary:     Effort: Pulmonary effort is normal.     Breath sounds: Normal breath sounds. No wheezing, rhonchi or rales.     Comments: Clear to auscultation bilaterally Psychiatric:        Behavior: Behavior is cooperative.      UC Treatments / Results  Labs (all labs ordered are listed, but only abnormal results are displayed) Labs Reviewed - No data to display  EKG   Radiology No results found.  Procedures Procedures (including critical care  time)  Medications Ordered in UC Medications - No data to display  Initial Impression / Assessment and Plan / UC Course  I have reviewed the triage vital signs and the nursing notes.  Pertinent labs & imaging results that were available during my care of the patient were reviewed by me and considered in my medical decision making (see chart for details).     Patient is well-appearing, afebrile, nontoxic, nontachycardic.  No indication for viral testing as patient has been symptomatic for several weeks and this would not change management.  Given prolonged and worsening symptoms concern for secondary bacterial infection.  Patient was started on Augmentin twice daily for 7 days.  She was also given a prednisone burst of 40 mg for 4 days with instruction not to take NSAIDs with this medication due to risk of GI bleeding.  Recommended over-the-counter medication including Mucinex, Flonase, Tylenol.  She is to rest and drink plenty of fluid.  Discussed that if symptoms are not improving by next week they should return for reevaluation or see PCP.  If any symptoms worsen and she develops high fever, chest pain, shortness of breath, nausea/vomiting interfering with oral intake, worsening cough she needs to be seen immediately.  Strict return precautions given.     Final Clinical Impressions(s) / UC Diagnoses   Final diagnoses:  Acute non-recurrent maxillary sinusitis     Discharge Instructions      Tome Augmentin dos veces [place] [address] Iniciar prednisona 40 mg durante 4 das. No tome AINE con este medicamento, incluidos aspirina, ibuprofeno/Advil, naproxeno/Aleve. Recomiendo medicamentos de venta libre, incluidos Mucinex, Flonase, Tylenol. Utilice enjuagues sinusales o solucin salina nasal para aliviar an ms los sntomas. Asegrese de descansar y beber mucho lquido. Si sus sntomas no mejoran en una semana, regrese para una reevaluacin. Si algo empeora y tiene dificultad para  respirar, tos que [place], fiebre, nuseas/vmitos que interfieren con la ingesta oral, debilidad, debe ser atendido de inmediato.     ED Prescriptions     Medication Sig Dispense Auth. Provider   amoxicillin-clavulanate (AUGMENTIN) 875-125 MG tablet Take 1 tablet by mouth every 12 (twelve) hours. 14 tablet Joshu Furukawa K, PA-C   predniSONE (DELTASONE) 20 MG tablet Take 2 tablets (40 mg total) by mouth daily for 4 days. 8 tablet Bricyn Labrada, Derry Skill, PA-C      PDMP not reviewed this encounter.   Terrilee Croak, PA-C 04/02/22 1110

## 2022-04-02 NOTE — ED Triage Notes (Signed)
Patient here today for cough, itchy throat, chest congestion, runny nose X 7 days. She states that 3 days ago she had a fever. She states that it is worse at night. Her family has had cold symptoms.

## 2022-04-02 NOTE — Discharge Instructions (Addendum)
[place] [address] Iniciar prednisona 40 mg durante 4 das. No tome AINE con este medicamento, incluidos aspirina, ibuprofeno/Advil, naproxeno/Aleve. Recomiendo medicamentos de venta libre, incluidos Mucinex, Flonase, Tylenol. Utilice enjuagues sinusales o solucin salina nasal para aliviar an ms los sntomas. Asegrese de descansar y beber mucho lquido. Si sus sntomas no mejoran en una semana, regrese para una reevaluacin. Si algo empeora y tiene dificultad para respirar, tos que [place], fiebre, nuseas/vmitos que interfieren con la ingesta oral, debilidad, debe ser atendido de inmediato.

## 2022-04-26 ENCOUNTER — Ambulatory Visit: Payer: 59 | Admitting: Rheumatology

## 2022-05-03 NOTE — Progress Notes (Addendum)
Office Visit Note  Patient: Patricia Mitchell             Date of Birth: 17-Dec-1960           MRN: [id number]             PCP: Kerin Perna, NP Referring: Jinny Sanders* Visit Date: 05/08/2022 Occupation: @GUAROCC @  Subjective:  Pain in joints  History of Present Illness: Denali Smither is a 62 y.o. female in consultation per request of her PCP.  According to the patient her symptoms started about 8 months ago with the pain in her right collarbone.  She works as a Secretary/administrator.  She states she has been having lower back pain, pain in her right elbow and both feet for almost a year.  She has not noticed any joint swelling.  There is no history of oral ulcers, nasal ulcers, malar rash, photosensitivity, Raynaud's or lymphadenopathy.  There is no family history of autoimmune disease.  Her sister most likely has osteoarthritis.  She is gravida 0, para 0.    Activities of Daily Living:  Patient reports morning stiffness for 1-2 minutes.   Patient Reports nocturnal pain.  Difficulty dressing/grooming: Reports Difficulty climbing stairs: Reports Difficulty getting out of chair: Reports Difficulty using hands for taps, buttons, cutlery, and/or writing: Denies  Review of Systems  Constitutional:  Negative for fatigue.  HENT:  Negative for mouth sores and mouth dryness.   Eyes:  Positive for dryness.  Respiratory:  Negative for shortness of breath.   Cardiovascular:  Negative for chest pain and palpitations.  Gastrointestinal:  Negative for blood in stool, constipation and diarrhea.  Endocrine: Positive for increased urination.  Genitourinary:  Negative for involuntary urination.  Musculoskeletal:  Positive for joint pain, joint pain, myalgias, morning stiffness and myalgias. Negative for gait problem, joint swelling, muscle weakness and muscle tenderness.  Skin:  Negative for color change, rash, hair loss and sensitivity to sunlight.  Allergic/Immunologic: Negative for  susceptible to infections.  Neurological:  Negative for dizziness and headaches.  Hematological:  Negative for swollen glands.  Psychiatric/Behavioral:  Negative for depressed mood and sleep disturbance. The patient is not nervous/anxious.     PMFS History:  Patient Active Problem List   Diagnosis Date Noted   Age-related cataract of both eyes 01/24/2022   Chronic night sweats 01/24/2022   Adenomatous polyp 05/23/2013   Osteopenia 10/19/2011   Premature surgical menopause 10/19/2011   Hyperlipidemia 07/11/2011   Constipation 07/11/2011    Past Medical History:  Diagnosis Date   Endometriosis of ovary    Hyperlipidemia    Osteopenia    Osteoporosis     Family History  Problem Relation Age of Onset   Prostate cancer Father    Cervical cancer Mother 47       dec   Colon cancer Neg Hx    Past Surgical History:  Procedure Laterality Date   CYST REMOVAL HAND Bilateral 02/07/2008   OOPHORECTOMY  1995, 1997   right taken out in 1995, left taken out in 1997 b/c of cysts   SUPRACERVICAL ABDOMINAL HYSTERECTOMY  2000   Social History   Social History Narrative   Spanish speaking   Heard about [place] from a family member   Lives at home with 62 yo brother      Last mammogram in 1996 (normal)   Last PAP in 2002 (normal)   Immunization History  Administered Date(s) Administered   Td (Adult), 2 Lf Tetanus Toxid, Preservative Free  09/03/2021   Tdap 10/19/2011     Objective: Vital Signs: BP 122/78 (BP Location: Right Arm, Patient Position: Sitting, Cuff Size: Normal)   Pulse 73   Resp 12   Ht 5\' 3"  (1.6 m)   Wt 155 lb (70.3 kg)   BMI 27.46 kg/m    Physical Exam Vitals and nursing note reviewed.  Constitutional:      Appearance: She is well-developed.  HENT:     Head: Normocephalic and atraumatic.  Eyes:     Conjunctiva/sclera: Conjunctivae normal.  Cardiovascular:     Rate and Rhythm: Normal rate and regular rhythm.     Heart sounds: Normal heart sounds.   Pulmonary:     Effort: Pulmonary effort is normal.     Breath sounds: Normal breath sounds.  Abdominal:     General: Bowel sounds are normal.     Palpations: Abdomen is soft.  Musculoskeletal:     Cervical back: Normal range of motion.  Lymphadenopathy:     Cervical: No cervical adenopathy.  Skin:    General: Skin is warm and dry.     Capillary Refill: Capillary refill takes less than 2 seconds.  Neurological:     Mental Status: She is alert and oriented to person, place, and time.  Psychiatric:        Behavior: Behavior normal.      Musculoskeletal Exam: Cervical, thoracic, lumbar spine were in good range of motion.  She had no SI joint tenderness.  Shoulders, elbow joints, wrist joints, MCPs PIPs and DIPs been good range of motion.  She had bilateral PIP and DIP thickening.  Hip joints and knee joints were in good range of motion without any warmth swelling or effusion.  There was no tenderness over ankles or MTPs.  She had some tenderness in the midfoot bilaterally without any swelling.  CDAI Exam: CDAI Score: -- Patient Global: --; Provider Global: -- Swollen: --; Tender: -- Joint Exam 05/08/2022   No joint exam has been documented for this visit   There is currently no information documented on the homunculus. Go to the Rheumatology activity and complete the homunculus joint exam.  Investigation: No additional findings.  Imaging: No results found.  Recent Labs: Lab Results  Component Value Date   WBC 7.9 09/13/2020   HGB 13.7 09/13/2020   PLT 213.0 09/13/2020   NA 140 09/13/2020   K 3.6 09/13/2020   CL 106 09/13/2020   CO2 26 09/13/2020   GLUCOSE 91 09/13/2020   BUN 15 09/13/2020   CREATININE 0.77 09/13/2020   BILITOT 0.6 09/13/2020   ALKPHOS 68 09/13/2020   AST 15 09/13/2020   ALT 22 09/13/2020   PROT 6.8 09/13/2020   ALBUMIN 4.2 09/13/2020   CALCIUM 8.8 09/13/2020   GFRAA 101 08/08/2019    Speciality Comments: No specialty comments  available.  Procedures:  No procedures performed Allergies: Patient has no known allergies.   Assessment / Plan:     Visit Diagnoses: Pain of right sternoclavicular joint -patient noticed prominence of the right sternoclavicular joint about 8 months ago.  She denies any discomfort or history of swelling.  She had no tenderness on the palpation.  No synovitis was noted.  Plan: XR Clavicle Right obtained today showed no abnormalities.  I advised to notify us if she develops any swelling.  Primary osteoarthritis of both hands-she does not complain of discomfort in her hands.  She had bilateral PIP and DIP thickening with no synovitis.  Clinical findings are consistent  with osteoarthritis.  Pain in both feet -she complains of discomfort in her midfoot bilaterally.  No synovitis was noted.  Plan: Sedimentation rate, Rheumatoid factor, Cyclic citrul peptide antibody, IgG, Uric acid, XR Foot 2 Views Right, XR Foot 2 Views Left.  X-rays showed PIP and DIP narrowing and inferior and posterior calcaneal spurs.  These findings were suggestive of early osteoarthritis of the feet.  Osteopenia of multiple sites - 10/ 25/23 T-score -1.6 right femoral neck.  T-score -1.6 lumbar spine.  Treated with Fosamax and Actonel in the past.  10/30/2017 T-score -1.9 BMD 0.954 lumbar.  Patient takes calcium and vitamin D.  Need for regular exercise was emphasized.  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  Pure hypercholesterolemia-she is currently not taking any lipid-lowering therapy.  Adenomatous polyp  Other age-related cataract of both eyes  Chronic night sweats  Language barrier - Spanish speaking  Orders: Orders Placed This Encounter  Procedures   XR Foot 2 Views Right   XR Foot 2 Views Left   XR Clavicle Right   Sedimentation rate   Rheumatoid factor   Cyclic citrul peptide antibody, IgG   Uric acid   No orders of the defined types were placed in this encounter.  Face-to-face  time spent patient was 50 minutes.  Greater than 50% time was spent in counseling and coordination of care.  Follow-Up Instructions: Return for Pain in multiple joints.   Bo Merino, MD  Note - This record has been created using Editor, commissioning.  Chart creation errors have been sought, but may not always  have been located. Such creation errors do not reflect on  the standard of medical care.

## 2022-05-08 ENCOUNTER — Ambulatory Visit: Payer: 59

## 2022-05-08 ENCOUNTER — Ambulatory Visit (INDEPENDENT_AMBULATORY_CARE_PROVIDER_SITE_OTHER): Payer: 59

## 2022-05-08 ENCOUNTER — Ambulatory Visit: Payer: 59 | Attending: Rheumatology | Admitting: Rheumatology

## 2022-05-08 ENCOUNTER — Encounter: Payer: Self-pay | Admitting: Rheumatology

## 2022-05-08 VITALS — BP 122/78 | HR 73 | Resp 12 | Ht 63.0 in | Wt 155.0 lb

## 2022-05-08 DIAGNOSIS — H2589 Other age-related cataract: Secondary | ICD-10-CM

## 2022-05-08 DIAGNOSIS — M79672 Pain in left foot: Secondary | ICD-10-CM | POA: Diagnosis not present

## 2022-05-08 DIAGNOSIS — D369 Benign neoplasm, unspecified site: Secondary | ICD-10-CM

## 2022-05-08 DIAGNOSIS — M25511 Pain in right shoulder: Secondary | ICD-10-CM | POA: Diagnosis not present

## 2022-05-08 DIAGNOSIS — Z603 Acculturation difficulty: Secondary | ICD-10-CM

## 2022-05-08 DIAGNOSIS — M19041 Primary osteoarthritis, right hand: Secondary | ICD-10-CM | POA: Diagnosis not present

## 2022-05-08 DIAGNOSIS — Z758 Other problems related to medical facilities and other health care: Secondary | ICD-10-CM

## 2022-05-08 DIAGNOSIS — M79671 Pain in right foot: Secondary | ICD-10-CM

## 2022-05-08 DIAGNOSIS — R61 Generalized hyperhidrosis: Secondary | ICD-10-CM

## 2022-05-08 DIAGNOSIS — M8589 Other specified disorders of bone density and structure, multiple sites: Secondary | ICD-10-CM

## 2022-05-08 DIAGNOSIS — Z789 Other specified health status: Secondary | ICD-10-CM

## 2022-05-08 DIAGNOSIS — M19042 Primary osteoarthritis, left hand: Secondary | ICD-10-CM

## 2022-05-08 DIAGNOSIS — E7800 Pure hypercholesterolemia, unspecified: Secondary | ICD-10-CM

## 2022-05-10 LAB — URIC ACID: Uric Acid, Serum: 3.4 mg/dL (ref 2.5–7.0)

## 2022-05-10 LAB — CYCLIC CITRUL PEPTIDE ANTIBODY, IGG: Cyclic Citrullin Peptide Ab: <16 UNITS

## 2022-05-10 LAB — RHEUMATOID FACTOR: Rheumatoid fact SerPl-aCnc: <14 IU/mL (ref <14)

## 2022-05-10 LAB — SEDIMENTATION RATE: Sed Rate: 11 mm/h (ref 0–30)

## 2022-05-10 NOTE — Progress Notes (Signed)
Sedimentation rate, rheumatoid factor, anti-CCP, uric acid are within normal limits.  I will discuss results at the follow-up visit.

## 2022-05-17 ENCOUNTER — Ambulatory Visit: Payer: 59 | Admitting: Rheumatology

## 2022-05-22 ENCOUNTER — Encounter (INDEPENDENT_AMBULATORY_CARE_PROVIDER_SITE_OTHER): Payer: Self-pay | Admitting: Primary Care

## 2022-05-22 ENCOUNTER — Ambulatory Visit (INDEPENDENT_AMBULATORY_CARE_PROVIDER_SITE_OTHER): Payer: 59 | Admitting: Primary Care

## 2022-05-22 VITALS — BP 125/78 | HR 73 | Resp 16 | Ht 63.5 in | Wt 152.8 lb

## 2022-05-22 DIAGNOSIS — E663 Overweight: Secondary | ICD-10-CM

## 2022-05-22 DIAGNOSIS — Z7689 Persons encountering health services in other specified circumstances: Secondary | ICD-10-CM

## 2022-05-22 DIAGNOSIS — Z13220 Encounter for screening for lipoid disorders: Secondary | ICD-10-CM

## 2022-05-22 DIAGNOSIS — R7303 Prediabetes: Secondary | ICD-10-CM

## 2022-05-22 DIAGNOSIS — E559 Vitamin D deficiency, unspecified: Secondary | ICD-10-CM | POA: Diagnosis not present

## 2022-05-22 DIAGNOSIS — Z6826 Body mass index (BMI) 26.0-26.9, adult: Secondary | ICD-10-CM

## 2022-05-22 DIAGNOSIS — M8589 Other specified disorders of bone density and structure, multiple sites: Secondary | ICD-10-CM | POA: Diagnosis not present

## 2022-05-22 NOTE — Progress Notes (Signed)
New Patient Office Visit  Subjective    Patient ID: Patricia Mitchell, female    DOB: 04-21-1960  Age: 62 y.o. MRN: [id number]  CC:  Chief Complaint  Patient presents with   New Patient (Initial Visit)    HPI 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 She voices no problems or concern. Patient has No headache, No chest pain, No abdominal pain - No Nausea, No new weakness tingling or numbness, No Cough - shortness of breath    Outpatient Encounter Medications as of 05/22/2022  Medication Sig   atorvastatin (LIPITOR) 40 MG tablet Take 40 mg by mouth daily.   albuterol (VENTOLIN HFA) 108 (90 Base) MCG/ACT inhaler Inhale 1-2 puffs into the lungs every 6 (six) hours as needed for wheezing or shortness of breath. (Patient not taking: Reported on 05/08/2022)   Calcium Carb-Cholecalciferol (CALCIUM + D3 PO) Take by mouth daily. (Patient not taking: Reported on 05/22/2022)   Misc Natural Products (JOINT SUPPORT PO) Take 2 capsules by mouth daily. (Patient not taking: Reported on 05/22/2022)   Multiple Vitamin (MULTI-VITAMIN) tablet Take 1 tablet by mouth daily. (Patient not taking: Reported on 05/22/2022)   vitamin E 200 UNIT capsule Take 200 Units by mouth daily. (Patient not taking: Reported on 05/22/2022)   No facility-administered encounter medications on file as of 05/22/2022.    Past Medical History:  Diagnosis Date   Endometriosis of ovary    Hyperlipidemia    Osteopenia    Osteoporosis     Past Surgical History:  Procedure Laterality Date   CYST REMOVAL HAND Bilateral 02/07/2008   OOPHORECTOMY  1995, 1997   right taken out in 1995, left taken out in 1997 b/c of cysts   SUPRACERVICAL ABDOMINAL HYSTERECTOMY  2000    Family History  Problem Relation Age of Onset   Prostate cancer Father    Cervical cancer Mother 25       dec   Colon cancer Neg Hx     Social History   Socioeconomic History   Marital status: Single    Spouse name: Not on  file   Number of children: Not on file   Years of education: Not on file   Highest education level: Not on file  Occupational History   Not on file  Tobacco Use   Smoking status: Never    Passive exposure: Never   Smokeless tobacco: Never  Vaping Use   Vaping Use: Never used  Substance and Sexual Activity   Alcohol use: No   Drug use: No   Sexual activity: Never    Birth control/protection: Surgical    Comment: Hyst  Other Topics Concern   Not on file  Social History Narrative   Spanish speaking   Marthe Patch about [place] from a family member   Lives at home with 18 yo brother      Last mammogram in 1996 (normal)   Last PAP in 2002 (normal)   Social Determinants of Health   Financial Resource Strain: Not on file  Food Insecurity: Not on file  Transportation Needs: Not on file  Physical Activity: Not on file  Stress: Not on file  Social Connections: Not on file  Intimate Partner Violence: Not on file    ROS Comprehensive ROS Pertinent positive and negative noted in HPI       Objective    Blood Pressure 125/78   Pulse 73   Respiration 16   Height 5' 3.5" (1.613 m)  Weight 152 lb 12.8 oz (69.3 kg)   Oxygen Saturation 97%   Body Mass Index 26.64 kg/m   General: No apparent distress. Eyes: Extraocular eye movements intact, pupils equal and round. Neck: Supple, trachea midline. Thyroid: No enlargement, mobile without fixation, no tenderness. Cardiovascular: Regular rhythm and rate, no murmur, normal radial pulses. Respiratory: Normal respiratory effort, clear to auscultation. Gastrointestinal: Normal pitch active bowel sounds, nontender abdomen without distention or appreciable hepatomegaly. Musculoskeletal: Normal muscle tone, no tenderness on palpation of tibia, no excessive thoracic kyphosis. Skin: Appropriate warmth, no visible rash. Mental status: Alert, conversant, speech clear, thought logical, appropriate mood and affect, no hallucinations or delusions  evident. Hematologic/lymphatic: No cervical adenopathy, no visible ecchymoses.     Assessment & Plan:   [name] was seen today for new patient (initial visit).  Diagnoses and all orders for this visit:  Encounter to establish care  Prediabetes -     Hemoglobin A1c -     CMP14+EGFR  Overweight (BMI 25.0-29.9) Discussed diet and exercise for person with BMI >25. Instructed: You must burn more calories than you eat. Losing 5 percent of your body weight should be considered a success. In the longer term, losing more than 15 percent of your body weight and staying at this weight is an extremely good result. However, keep in mind that even losing 5 percent of your body weight leads to important health benefits, so try not to get discouraged if you're not able to lose more than this. Will recheck weight in 3-6 months.   Lipid screening Lipid panel  Vitamin D deficiency 2/2 Osteopenia of multiple sites  -     VITAMIN D 25 Hydroxy (Vit-D Deficiency, Fractures)    [name], NP

## 2022-05-22 NOTE — Patient Instructions (Addendum)
Recuento de caloras para bajar de peso Calorie Counting for [employer] Las caloras son unidades de Teacher, early years/pre. El cuerpo necesita una cierta cantidad de caloras de los alimentos para que lo ayuden a funcionar durante todo Games developer. Cuando se comen o beben ms caloras de las que el cuerpo [place], este acumula las caloras adicionales mayormente como grasa. Cuando se comen o beben menos caloras de las que el cuerpo [place], este quema grasa para obtener la energa que necesita. El recuento de caloras es el registro de la cantidad de caloras que se comen y English as a second language teacher. El recuento de caloras puede ser de ayuda si necesita perder peso. Si come menos caloras de las que el cuerpo necesita, debera bajar de [place]. Pregntele al mdico cul es un peso sano para usted. Para que el recuento de caloras funcione, usted tendr que ingerir la cantidad de caloras adecuadas cada da, para bajar una cantidad de peso saludable por semana. Un nutricionista puede ayudar a determinar la cantidad de caloras que usted necesita por da y sugerirle formas de Science writer su objetivo calrico. Ardelia Mems cantidad de peso saludable para bajar cada semana suele ser entre 1 y 2 libras (0.5 a 0.9 kg). Esto habitualmente significa que su ingesta diaria de caloras se debera reducir en unas 500 a 750 caloras. Ingerir de 1200 a 1500 caloras por Administrator, Civil Service a la [employer] de las mujeres a Sports coach de [place]. Ingerir de 1500 a 1800 caloras por Administrator, Civil Service a la [employer] de los hombres a Sports coach de [place]. Qu debo saber acerca del recuento de caloras? Trabaje con el mdico o el nutricionista para determinar cuntas caloras debe recibir Armed forces operational officer. A fin de alcanzar su objetivo diario de caloras, tendr que: Averiguar cuntas caloras hay en cada alimento que le Therapist, occupational. Intente hacerlo antes de comer. Decidir la cantidad que puede comer del alimento. Llevar un registro de los alimentos. Para esto, anote lo que comi y cuntas  caloras tena. Para perder peso con xito, es importante equilibrar el recuento de caloras con un estilo de vida saludable que incluya actividad fsica de forma regular. Dnde encuentro informacin sobre las caloras?  Es posible Animator cantidad de caloras que contiene un alimento en la etiqueta de informacin nutricional. Si un alimento no tiene una etiqueta de informacin nutricional, intente buscar las caloras en Internet o pida ayuda al nutricionista. Recuerde que las caloras se calculan por porcin. Si opta por comer ms de una porcin de un alimento, tendr [employer] las caloras de una porcin por la cantidad de porciones que planea comer. Por ejemplo, la etiqueta de un envase de pan puede decir que el tamao de una porcin es 1 rodaja, y que una porcin tiene 90 caloras. Si come 1 rodaja, habr comido 90 caloras. Si come 2 rodajas, habr comido 180 caloras. Cmo llevo un registro de comidas? Despus de cada vez que coma, anote lo siguiente en el registro de alimentos lo antes posible: Lo que comi. Asegrese de [employer], las salsas y otros extras [employer] La cantidad que comi. Esto se puede medir en tazas, onzas o cantidad de alimentos. Cuntas caloras haba en cada alimento y en cada bebida. La cantidad total de caloras en la comida que tom. Tenga a Materials engineer de alimentos, por ejemplo, en un anotador de bolsillo o utilice una aplicacin o sitio web en el telfono mvil. Algunos programas calcularn las caloras por usted y Automotive engineer la cantidad de  caloras que le quedan para llegar al objetivo diario. Cules son algunos consejos para controlar las porciones? Sepa cuntas caloras hay en una porcin. Esto lo ayudar a saber cuntas porciones de un alimento determinado puede comer. Use una taza medidora para medir los tamaos de las porciones. Tambin puede intentar pesar las porciones en una balanza de cocina. Con el tiempo, podr hacer  un clculo estimativo de los tamaos de las porciones de algunos alimentos. Dedique tiempo a poner porciones de diferentes alimentos en sus platos, tazones y tazas predilectos, a fin de saber cmo se ve una porcin. Intente no comer directamente de un envase de alimentos, por ejemplo, de una bolsa o una caja. Comer directamente del envase dificulta ver cunto est comiendo y puede conducir a comer en exceso. Ponga la cantidad que le gustara comer en una taza o un plato, a fin de asegurarse de que est comiendo la porcin correcta. Use platos, vasos y tazones ms pequeos para medir porciones ms pequeas y evitar no comer en exceso. Intente no realizar varias tareas al mismo tiempo. Por ejemplo, evite mirar televisin o usar la computadora mientras come. Si es la hora de comer, sintese a la mesa y disfrute de la comida. Esto lo ayudar a reconocer cundo est satisfecho. Tambin le permitir estar ms consciente de qu come y cunto come. Consejos para seguir este plan Al leer las etiquetas de los alimentos Controle el recuento de caloras en comparacin con el tamao de la porcin. El tamao de la porcin puede ser ms pequeo de lo que suele comer. Verifique la fuente de las caloras. Intente elegir alimentos ricos en protenas, fibras y vitaminas, y bajos en grasas saturadas, grasas trans y sodio. Al ir de compras Lea las etiquetas nutricionales cuando compre. Esto lo ayudar a tomar decisiones saludables sobre qu alimentos comprar. Preste atencin a las etiquetas nutricionales de alimentos bajos en grasas o sin grasas. Estos alimentos a veces tienen la misma cantidad de caloras o ms caloras que las versiones ricas en grasas. Con frecuencia, tambin tienen agregados de azcar, almidn o sal, para darles el sabor que fue eliminado con las grasas. Haga una lista de compras con los alimentos que tienen un menor contenido de caloras y resptela. Al cocinar Intente cocinar sus alimentos preferidos  de una manera ms saludable. Por ejemplo, pruebe hornear en vez de frer. Utilice productos lcteos descremados. Planificacin de las comidas Utilice ms frutas y verduras. La mitad de su plato debe ser de frutas y verduras. Incluya protenas magras, como pollo, pavo y pescado. Estilo de vida Cada semana, trate de hacer una de las siguientes cosas: 150 minutos de ejercicio moderado, como caminar. 75 minutos de ejercicio enrgico, como correr. Informacin general Sepa cuntas caloras tienen los alimentos que come con ms frecuencia. Esto le ayudar a contar las caloras ms rpidamente. Encuentre un mtodo para controlar las caloras que funcione para usted. Sea creativo. Pruebe aplicaciones o programas distintos, si llevar un registro de las caloras no funciona para usted. Qu alimentos debo consumir?  Consuma alimentos nutritivos. Es mejor comer un alimento nutritivo, de alto contenido calrico, como un aguacate, que uno con pocos nutrientes, como una bolsa de patatas fritas. Use sus caloras en alimentos y bebidas que lo sacien y no lo dejen con apetito apenas termina de comer. Ejemplos de alimentos que lo sacian son los frutos secos y mantequillas de frutos secos, verduras, protenas magras y alimentos con alto contenido de fibra como los cereales integrales. Los alimentos con alto   contenido de fibra son aquellos que tienen ms de 5 g de fibra por porcin. Preste atencin a las caloras en las bebidas. Las bebidas de bajas caloras incluyen agua y refrescos sin azcar. Es posible que los productos que se enumeran ms arriba no constituyan una lista completa de los alimentos y las bebidas que puede tomar. Consulte a un nutricionista para obtener ms informacin. Qu alimentos debo limitar? Limite el consumo de alimentos o bebidas que no sean buenas fuentes de vitaminas, minerales o protenas, o que tengan alto contenido de grasas no saludables. Estos incluyen: Caramelos. Otros  dulces. Refrescos, bebidas con caf especiales, alcohol y jugo. Es posible que los productos que se enumeran ms arriba no constituyan una lista completa de los alimentos y las bebidas que debe evitar. Consulte a un nutricionista para obtener ms informacin. Cmo puedo hacer el recuento de caloras cuando como afuera? Preste atencin a las porciones. A menudo, las porciones son mucho ms grandes al comer afuera. Pruebe con estos consejos para mantener las porciones ms pequeas: Considere la posibilidad de compartir una comida en lugar de tomarla toda usted solo. Si pide su propia comida, coma solo la mitad. Antes de empezar a comer, pida un recipiente y ponga la mitad de la comida en l. Cuando sea posible, considere la posibilidad de pedir porciones ms pequeas del men en lugar de porciones completas. Preste atencin a la eleccin de alimentos y bebidas. Saber la forma en que se cocinan los alimentos y lo que incluye la comida puede ayudarlo a ingerir menos caloras. Si se detallan las caloras en el men, elija las opciones que contengan la menor cantidad. Elija platos que incluyan verduras, frutas, cereales integrales, productos lcteos con bajo contenido de grasa y protenas magras. Opte por los alimentos hervidos, asados, cocidos a la parrilla o al vapor. Evite los alimentos a los que se les ponga mantequilla, que estn empanados o fritos, o que se sirvan con salsa a base de crema. Generalmente, los alimentos que se etiquetan como "crujientes" estn fritos, a menos que se indique lo contrario. Elija el agua, la leche descremada, el t helado sin azcar u otras bebidas que no contengan azcares agregados. Si desea una bebida alcohlica, escoja una opcin con menos caloras, como una copa de vino o una cerveza ligera. Ordene los aderezos, las salsas y los jarabes aparte. Estos son, con frecuencia, de alto contenido en caloras, por lo que debe limitar la cantidad que ingiere. Si desea una  ensalada, elija una de hortalizas y pida carnes a la parrilla. Evite las guarniciones adicionales como el tocino, el queso o los alimentos fritos. Ordene el aderezo aparte o pida aceite de oliva y vinagre o limn para aderezar. Haga un clculo estimativo de la cantidad de porciones que le sirven. Conocer el tamao de las porciones lo ayudar a estar atento a la cantidad de comida que come en los restaurantes. Dnde buscar ms informacin Centers for Disease Control and Prevention (Centros para el Control y la Prevencin de Enfermedades): www.cdc.gov U.S. Department of Agriculture (Departamento de Agricultura de los EE. UU.): myplate.gov Resumen El recuento de caloras es el registro de la cantidad de caloras que se comen y beben cada da. Si come menos caloras de las que el cuerpo necesita, debera bajar de peso. Una cantidad de peso saludable para bajar por semana suele ser entre 1 y 2 libras (0.5 a 0.9 kg). Esto significa, con frecuencia, reducir su ingesta diaria de caloras unas 500 a 750 caloras. Es posible   encontrar la cantidad de caloras que contiene un alimento en la etiqueta de informacin nutricional. Si un alimento no tiene una etiqueta de informacin nutricional, intente buscar las caloras en Internet o pida ayuda al nutricionista. Use platos, vasos y tazones ms pequeos para medir porciones ms pequeas y Automotive engineer no comer en exceso. Use sus caloras en alimentos y bebidas que lo sacien y no lo dejen con apetito poco tiempo despus de haber comido. Esta informacin no tiene Theme park manager el consejo del mdico. Asegrese de hacerle al mdico cualquier pregunta que tenga. Document Revised: 05/19/2019 Document Reviewed: 05/19/2019 Elsevier Patient Education  2023 Elsevier Inc. [place]  La culebrilla es una infeccin. Causa una erupcin en la piel dolorosa y ampollas llenas de lquido. La culebrilla es causada por el mismo germen (virus) que causa la varicela. La  culebrilla solo se presenta en personas que: Han tenido varicela. Han recibido una inyeccin (vacuna) para protegerse de la varicela. La culebrilla es poco frecuente en ese grupo. Cules son las causas? Esta afeccin es causada por el virus de la varicela zster. Este es el mismo germen que causa la varicela. Despus de que una persona se expone al germen, este permanece en el cuerpo, pero no est activo (latente). La culebrilla se presenta si el germen vuelve a estar activo (se reactiva). Esto puede ocurrir muchos aos despus de la primera exposicin al germen. Se desconoce qu hace que este germen vuelva a activarse. Qu incrementa el riesgo? Las personas que han tenido varicela o han recibido la vacuna contra la varicela estn en riesgo de tener culebrilla. Esta infeccin es ms frecuente en las personas que: [place] de 60 aos de [place]. Tienen debilitado el sistema que combate las enfermedades (sistema inmunitario), por ejemplo, las personas que tienen: VIH (virus de inmunodeficiencia [place]). SIDA (sndrome de inmunodeficiencia adquirida). Cncer. Reciben medicamentos que debilitan el sistema inmunitario, como los medicamentos que se indican en caso de trasplantes de rganos. Tienen mucho estrs. Cules son los signos o sntomas? Los primeros sntomas de la culebrilla pueden ser picazn, hormigueo o dolor en una zona de la piel. Algunos das o semanas despus, aparece una erupcin en la piel. Esto es lo que suele ocurrir: Es probable que la erupcin aparezca en un solo lado del cuerpo. Generalmente, la erupcin tiene la forma de una franja o banda. Con el paso del [place], la erupcin se convierte en un grupo de ampollas llenas de lquido. Las ampollas se rompern y se Forensic scientist. Las costras suelen secarse en unas 2 o 3 semanas. Tambin puede tener: Grant Ruts. [place]alofros. Dolor de [place]. Ganas de vomitar (nuseas). Cmo se trata? La erupcin cutnea puede durar  varias semanas. No hay una cura especfica para esta afeccin. El mdico puede recetarle medicamentos. Los medicamentos pueden: Ayudar a Engineer, materials. Lograr una mejora ms pronto. Prevenir problemas a Air cabin crew. Ayudar a Associate Professor (antihistamnicos). Si la zona afectada est en el rostro, puede ser necesario que consulte a Music therapist. Este puede ser un oftalmlogo o un mdico de [place], garganta y odo (otorrinolaringlogo). Siga estas instrucciones en su casa: Medicamentos [employer] de venta libre y los recetados solamente como se lo haya indicado el mdico. Pngase una crema para aliviar la picazn o para adormecer la zona donde tiene la erupcin, las ampollas o las costras. Hgalo como se lo haya indicado el mdico. Para ayudar a Associate Professor y las molestias  Pngase paos hmedos y fros (compresas fras) sobre  la zona de la erupcin cutnea o las ampollas siguiendo las indicaciones del mdico. [place] baos con agua fra pueden ayudarlo a sentirse mejor. Pruebe agregar bicarbonato de sodio o avena seca en el agua para Associate Professor. No se bae con agua caliente. Use locin de calamina como se lo haya indicado el mdico. Cuidado de las ampollas y la erupcin cutnea Mantenga la zona de la erupcin cutnea cubierta con una venda floja (vendaje). Use ropa holgada que no roce contra la erupcin. Lvese las manos con agua y jabn durante al menos 20 segundos antes y despus de cambiarse la venda. Use un desinfectante para manos si no dispone de [place] y [place]. Cambie la venda como se lo haya indicado el mdico. Mantenga la erupcin y las ampollas limpias. Para hacerlo, lave la zona con Caro Hight y agua fra siguiendo las indicaciones del mdico. Verifique la erupcin cutnea todos los [address]  [address] para detectar signos de infeccin. Est atento a los siguientes signos: Aumento del enrojecimiento, la hinchazn o Chief Technology Officer. Lquido o sangre. Calor. Pus o mal  olor. No se rasque el rea de la erupcin. No se toque las ampollas. Para evitar rascarse: Tenga las uas siempre cortas y limpias. Si no puede dejar de rascarse, use guantes o mitones mientras duerme. Instrucciones generales [place]aga reposo como se lo haya indicado el mdico. Lvese las manos regularmente con agua y jabn durante al menos 20 segundos. Use un desinfectante para manos si no dispone de [place] y [place]. Al hacerlo, reduce sus probabilidades de contraer una infeccin en la piel. Su infeccin puede causar varicela en las personas que nunca tuvieron la enfermedad o que nunca recibieron la vacuna contra la varicela. Si tiene [employer] an no se formaron costras, intente no tocar a Economist ni estar cerca de Economist, especialmente: Los bebs. Las [employer]. Los nios que tienen zonas de piel enrojecida, spera o con picazn (eczema). Personas de edad avanzada que tienen un trasplante de rgano. Personas que tienen enfermedades a largo plazo (crnicas), como cncer o sndrome de inmunodeficiencia adquirida (SIDA). Concurra a todas las visitas de seguimiento. Cmo se evita? La vacuna es la mejor forma de prevenir la culebrilla y protegerse contra los problemas que provoca. Si no se ha vacunado, hable con su mdico sobre cmo recibir la vacuna. Dnde buscar ms informacin Centers for Disease Control and Prevention (Centros para el Control y la Prevencin de Event organiser): [email or web address] Comunquese con un mdico si: El dolor no mejora con medicamentos. El dolor no mejora despus de que se cura la erupcin cutnea. Tiene cualquiera de estos signos de infeccin alrededor de la erupcin: Aumento del enrojecimiento, la hinchazn o Chief Technology Officer. Lquido o sangre. Calor. Pus o mal olor. Tiene fiebre. Solicite ayuda de inmediato si: La erupcin cutnea est en el rostro o en la nariz. Tiene dolor en el rostro o cerca de los ojos. Pierde la sensacin en un lado  del rostro. Tiene dificultad para ver. Siente dolor o zumbido en los odos. Presenta prdida del gusto. Su afeccin empeora. Resumen La culebrilla causa una erupcin en la piel dolorosa y ampollas llenas de lquido. La culebrilla es causada por el mismo germen (virus) que causa la varicela. Mantenga la zona de la erupcin cutnea cubierta con una venda floja. Use ropa holgada que no roce contra la erupcin. Si tiene [employer] an no se formaron costras, intente no tocar a Economist ni estar cerca de Economist. Esta informacin no tiene  como fin reemplazar el consejo del mdico. Asegrese de hacerle al mdico cualquier pregunta que tenga. Document Revised: 02/13/2020 Document Reviewed: 02/13/2020 Elsevier Patient Education  2023 [employer].

## 2022-05-23 LAB — CMP14+EGFR
ALT: 18 IU/L (ref 0–32)
AST: 21 IU/L (ref 0–40)
Albumin/Globulin Ratio: 1.9 (ref 1.2–2.2)
Albumin: 4.7 g/dL (ref 3.9–4.9)
Alkaline Phosphatase: 96 IU/L (ref 44–121)
BUN/Creatinine Ratio: 19 (ref 12–28)
BUN: 12 mg/dL (ref 8–27)
Bilirubin Total: 0.3 mg/dL (ref 0.0–1.2)
CO2: 24 mmol/L (ref 20–29)
Calcium: 9.5 mg/dL (ref 8.7–10.3)
Chloride: 102 mmol/L (ref 96–106)
Creatinine, Ser: 0.64 mg/dL (ref 0.57–1.00)
Globulin, Total: 2.5 g/dL (ref 1.5–4.5)
Glucose: 92 mg/dL (ref 70–99)
Potassium: 4.3 mmol/L (ref 3.5–5.2)
Sodium: 142 mmol/L (ref 134–144)
Total Protein: 7.2 g/dL (ref 6.0–8.5)
eGFR: 100 mL/min/{1.73_m2} (ref >59)

## 2022-05-23 LAB — VITAMIN D 25 HYDROXY (VIT D DEFICIENCY, FRACTURES): Vit D, 25-Hydroxy: 41.9 ng/mL (ref 30.0–100.0)

## 2022-05-23 LAB — HEMOGLOBIN A1C
Est. average glucose Bld gHb Est-mCnc: 120 mg/dL
Hgb A1c MFr Bld: 5.8 % — ABNORMAL HIGH (ref 4.8–5.6)

## 2022-05-24 ENCOUNTER — Ambulatory Visit: Payer: Commercial Managed Care - HMO | Admitting: Emergency Medicine

## 2022-05-25 NOTE — Progress Notes (Signed)
Office Visit Note  Patient: Patricia Mitchell             Date of Birth: 21-Feb-1960           MRN: [id number]             PCP: Patricia Sessions, NP Referring: Patricia Sessions, NP Visit Date: 06/08/2022 Occupation: @GUAROCC @  Interpreter: Patricia Mitchell (CAP)  Subjective:  Pain in multiple joints  History of Present Illness: Patricia Mitchell is a 62 y.o. female returns for follow-up visit today for the evaluation of joint pain.  She states she continues to have pain and stiffness in her bilateral hands and her bilateral feet.  She describes discomfort in the right plantar fascia.  None of the other joints are painful.  She states she has been having some discomfort in her right buttock when she sits in the car.  She has been using cushion.    Activities of Daily Living:  Patient reports morning stiffness for a few steps. Patient Denies nocturnal pain.  Difficulty dressing/grooming: Denies Difficulty climbing stairs: Denies Difficulty getting out of chair: Denies Difficulty using hands for taps, buttons, cutlery, and/or writing: Denies  Review of Systems  Constitutional:  Negative for fatigue.  HENT:  Positive for mouth dryness. Negative for mouth sores.   Eyes:  Positive for dryness.  Respiratory:  Negative for shortness of breath.   Cardiovascular:  Negative for chest pain and palpitations.  Gastrointestinal:  Negative for blood in stool, constipation and diarrhea.  Endocrine: Negative for heat intolerance.  Genitourinary:  Negative for difficulty urinating and involuntary urination.  Musculoskeletal:  Positive for joint pain, joint pain, joint swelling, muscle weakness and morning stiffness. Negative for gait problem, myalgias, muscle tenderness and myalgias.  Skin:  Positive for sensitivity to sunlight. Negative for color change and hair loss.  Allergic/Immunologic: Negative for susceptible to infections.  Neurological:  Negative for dizziness and headaches.  Hematological:   Negative for swollen glands.  Psychiatric/Behavioral:  Negative for depressed mood and sleep disturbance. The patient is not nervous/anxious.     PMFS History:  Patient Active Problem List   Diagnosis Date Noted   Age-related cataract of both eyes 01/24/2022   Chronic night sweats 01/24/2022   Adenomatous polyp 05/23/2013   Osteopenia 10/19/2011   Premature surgical menopause 10/19/2011   Hyperlipidemia 07/11/2011   Constipation 07/11/2011    Past Medical History:  Diagnosis Date   Endometriosis of ovary    Hyperlipidemia    Osteopenia    Osteoporosis     Family History  Problem Relation Age of Onset   Prostate cancer Father    Cervical cancer Mother 61       dec   Colon cancer Neg Hx    Past Surgical History:  Procedure Laterality Date   CYST REMOVAL HAND Bilateral 02/07/2008   OOPHORECTOMY  1995, 1997   right taken out in 1995, left taken out in 1997 b/c of cysts   SUPRACERVICAL ABDOMINAL HYSTERECTOMY  2000   Social History   Social History Narrative   Spanish speaking   Heard about [place] from a family member   Lives at home with 79 yo brother      Last mammogram in 1996 (normal)   Last PAP in 2002 (normal)   Immunization History  Administered Date(s) Administered   Td (Adult), 2 Lf Tetanus Toxid, Preservative Free 09/03/2021   Tdap 10/19/2011     Objective: Vital Signs: BP 136/81 (BP Location: Left Arm, Patient Position:  Sitting, Cuff Size: Normal)   Pulse 69   Resp 15   Ht 5\' 3"  (1.6 m)   Wt 155 lb 6.4 oz (70.5 kg)   BMI 27.53 kg/m    Physical Exam Vitals and nursing note reviewed.  Constitutional:      Appearance: She is well-developed.  HENT:     Head: Normocephalic and atraumatic.  Eyes:     Conjunctiva/sclera: Conjunctivae normal.  Cardiovascular:     Rate and Rhythm: Normal rate and regular rhythm.     Heart sounds: Normal heart sounds.  Pulmonary:     Effort: Pulmonary effort is normal.     Breath sounds: Normal breath sounds.   Abdominal:     General: Bowel sounds are normal.     Palpations: Abdomen is soft.  Musculoskeletal:     Cervical back: Normal range of motion.  Lymphadenopathy:     Cervical: No cervical adenopathy.  Skin:    General: Skin is warm and dry.     Capillary Refill: Capillary refill takes less than 2 seconds.  Neurological:     Mental Status: She is alert and oriented to person, place, and time.  Psychiatric:        Behavior: Behavior normal.      Musculoskeletal Exam: Cervical, thoracic and lumbar spine were in good range of motion.  Shoulder joints, elbow joints, wrist joints, MCPs PIPs and DIPs were in good range of motion.  She had bilateral PIP and DIP thickening.  Hip joints and knee joints were in good range of motion without any warmth swelling or effusion.  She had no tenderness over ankles or MTPs.  She had tenderness over right plantar fascia consistent with plantar fasciitis.  She also had mild tenderness over right ischial bursa.  CDAI Exam: CDAI Score: -- Patient Global: --; Provider Global: -- Swollen: --; Tender: -- Joint Exam 06/08/2022   No joint exam has been documented for this visit   There is currently no information documented on the homunculus. Go to the Rheumatology activity and complete the homunculus joint exam.  Investigation: No additional findings.  Imaging: No results found.  Recent Labs: Lab Results  Component Value Date   WBC 7.9 09/13/2020   HGB 13.7 09/13/2020   PLT 213.0 09/13/2020   NA 142 05/22/2022   K 4.3 05/22/2022   CL 102 05/22/2022   CO2 24 05/22/2022   GLUCOSE 92 05/22/2022   BUN 12 05/22/2022   CREATININE 0.64 05/22/2022   BILITOT 0.3 05/22/2022   ALKPHOS 96 05/22/2022   AST 21 05/22/2022   ALT 18 05/22/2022   PROT 7.2 05/22/2022   ALBUMIN 4.7 05/22/2022   CALCIUM 9.5 05/22/2022   GFRAA 101 08/08/2019   May 08, 2022 ESR 11, RF negative, anti-CCP negative, uric acid 3.4  May 22, 2022 hemoglobin A1c 5.8, vitamin  D41.9  Speciality Comments: No specialty comments available.  Procedures:  No procedures performed Allergies: Patient has no known allergies.   Assessment / Plan:     Visit Diagnoses: Pain of right sternoclavicular joint - Prominence of right sternoclavicular joint for the last 8 months.  No discomfort.  No synovitis noted.  X-rays were unremarkable.  X-ray findings were discussed with the patient.  Lab results were discussed with the patient.  All autoimmune labs were negative.  Primary osteoarthritis of both hands - Clinical findings consistent with osteoarthritis.  Detailed counseling osteoarthritis was provided.  Joint protection muscle strengthening was discussed.  A handout on hand exercises was  given.  Use of arthritis compression gloves was advised.    Primary osteoarthritis of both feet - History of discomfort in her feet.  X-rays were consistent with osteoarthritis.  Proper fitting shoes were advised.  Plantar fasciitis of right foot-she had mild tenderness over right plantar fascia.  A handout on plantar fascia exercises was given.  Use of orthotics was advised.  Advised patient to contact us if her symptoms do not improve or get worse.  She would benefit from cortisone injection in the future if she has persistent symptoms.  Ischial bursitis of right side-she had tenderness over right ischial bursa.  She has been using a cushion which has been helpful.  Stretching exercises were discussed.  Osteopenia of multiple sites - 10/ 25/23 T-score -1.6 right femoral neck.  T-score -1.6 lumbar spine.  Treated with Fosamax and Actonel in the past.  10/30/2017 T-score -1.9 BMD 0.954 lumbar.  Other medical problems are listed as follows:  Pure hypercholesterolemia  Language barrier-interpreter Patricia Mitchell was present in the room.  Chronic night sweats  Adenomatous polyp  Other age-related cataract of both eyes  Orders: No orders of the defined types were placed in this  encounter.  No orders of the defined types were placed in this encounter.    Follow-Up Instructions: Return if symptoms worsen or fail to improve, for Osteoarthritis.   Pollyann Savoy, MD  Note - This record has been created using Animal nutritionist.  Chart creation errors have been sought, but may not always  have been located. Such creation errors do not reflect on  the standard of medical care.

## 2022-06-08 ENCOUNTER — Ambulatory Visit: Payer: 59 | Attending: Rheumatology | Admitting: Rheumatology

## 2022-06-08 ENCOUNTER — Encounter: Payer: Self-pay | Admitting: Rheumatology

## 2022-06-08 VITALS — BP 136/81 | HR 69 | Resp 15 | Ht 63.0 in | Wt 155.4 lb

## 2022-06-08 DIAGNOSIS — M19071 Primary osteoarthritis, right ankle and foot: Secondary | ICD-10-CM

## 2022-06-08 DIAGNOSIS — H2589 Other age-related cataract: Secondary | ICD-10-CM

## 2022-06-08 DIAGNOSIS — M19042 Primary osteoarthritis, left hand: Secondary | ICD-10-CM

## 2022-06-08 DIAGNOSIS — M8589 Other specified disorders of bone density and structure, multiple sites: Secondary | ICD-10-CM

## 2022-06-08 DIAGNOSIS — E7800 Pure hypercholesterolemia, unspecified: Secondary | ICD-10-CM

## 2022-06-08 DIAGNOSIS — M722 Plantar fascial fibromatosis: Secondary | ICD-10-CM | POA: Diagnosis not present

## 2022-06-08 DIAGNOSIS — Z758 Other problems related to medical facilities and other health care: Secondary | ICD-10-CM

## 2022-06-08 DIAGNOSIS — M19072 Primary osteoarthritis, left ankle and foot: Secondary | ICD-10-CM

## 2022-06-08 DIAGNOSIS — M19041 Primary osteoarthritis, right hand: Secondary | ICD-10-CM

## 2022-06-08 DIAGNOSIS — M7071 Other bursitis of hip, right hip: Secondary | ICD-10-CM

## 2022-06-08 DIAGNOSIS — D369 Benign neoplasm, unspecified site: Secondary | ICD-10-CM

## 2022-06-08 DIAGNOSIS — M25511 Pain in right shoulder: Secondary | ICD-10-CM

## 2022-06-08 DIAGNOSIS — Z603 Acculturation difficulty: Secondary | ICD-10-CM

## 2022-06-08 DIAGNOSIS — R61 Generalized hyperhidrosis: Secondary | ICD-10-CM

## 2022-06-08 NOTE — Patient Instructions (Addendum)
Fascitis plantar, rehabilitacin Plantar Fasciitis Rehab Pregunte al mdico qu ejercicios son seguros para usted. Haga los ejercicios exactamente como se lo haya indicado el mdico y gradelos como se lo hayan indicado. Es normal sentir un estiramiento leve, tironeo, opresin o malestar al hacer estos ejercicios. Detngase de inmediato si siente un dolor repentino o el dolor empeora. No comience a hacer estos ejercicios hasta que se lo indique el mdico. Ejercicios de elongacin y amplitud de movimiento Estos ejercicios calientan los msculos y las articulaciones, y mejoran la movilidad y la flexibilidad del pie. Adems, ayudan a aliviar el dolor. Estiramiento de la fascia plantar  Sintese con la pierna izquierda/derecha cruzada sobre la rodilla opuesta. Sostenga el taln con una mano, con el pulgar cerca del arco. Con la otra mano, sostenga los dedos de los pies y empjelos con cuidado hacia arriba. Debe sentir un estiramiento en la base (la parte de abajo) de los dedos o en la parte de abajo del pie (fascia plantar), o en ambos. Mantenga esta posicin durante _________ segundos. Afloje lentamente los dedos y vuelva a la posicin inicial. Repita __________ veces. Realice este ejercicio __________ veces al da. Estiramiento de los gemelos, de pie Este ejercicio tambin se denomina estiramiento de la pantorrilla (los msculos gemelos). Estira los msculos posteriores de la parte superior de la pantorrilla. Prese con las manos apoyadas sobre la pared. Extienda la pierna izquierda/derecha hacia atrs y flexione ligeramente la rodilla de la pierna de adelante. Mantenga los talones apoyados en el suelo, los dedos apuntando hacia delante y la rodilla de atrs extendida, y lleve el peso hacia la pared. No arquee la espalda. Debe sentir un ligero estiramiento en la parte superior de la pantorrilla. Mantenga esta posicin durante __________ segundos. Repita __________ veces. Realice este ejercicio  __________ veces al da. Estiramiento del msculo sleo, de pie Este ejercicio tambin se denomina estiramiento de la pantorrilla (sleo). Estira los msculos posteriores de la parte inferior de la pantorrilla. Prese con las manos apoyadas sobre la pared. Extienda la pierna izquierda/derecha hacia atrs y flexione ligeramente la rodilla de la pierna de adelante. Mantenga los talones apoyados en el suelo y los dedos apuntando hacia delante, flexione la rodilla de atrs y lleve el peso ligeramente a la pierna de atrs. Debe sentir un estiramiento suave en la parte profunda de la parte inferior de la pantorrilla. Mantenga esta posicin durante __________ segundos. Repita __________ veces. Realice este ejercicio __________ veces al da. Estiramiento de los msculos gemelos y sleo, de pie con un escaln Este ejercicio estira los msculos posteriores de la parte inferior de la pierna. Estos msculos se encuentran en la parte superior de la pantorrilla (gastrocnemio) y la parte inferior de la pantorrilla (sleo). Prese delante de un escaln apoyando solo la regin metatarsiana de su pie derecho/izquierdo. La regin metatarsiana del pie es la superficie sobre la que caminamos, justo debajo de los dedos. Mantenga el otro pie apoyado con firmeza en el mismo escaln. Sostngase de la pared o de una baranda para mantener el equilibrio. Levante lentamente el otro pie, y permita que el peso del cuerpo presione el taln sobre el borde del frente del escaln. Mantenga la rodilla recta y sin doblar. Debe sentir un estiramiento en la pantorrilla. Mantenga esta posicin durante __________ segundos. Vuelva a poner ambos pies sobre el escaln. Repita este ejercicio con una leve flexin en la rodilla izquierda/derecha. Reptalo __________ veces con la rodilla izquierda/derecha extendida y __________ veces con la rodilla izquierda/derecha flexionada. Realice este ejercicio   __________ veces al da. Ejercicio de  equilibrio Este ejercicio aumenta el equilibrio y el control de la fuerza del arco, para ayudar a reducir la presin sobre la fascia plantar. Pararse sobre una pierna Si este ejercicio es muy fcil, puede intentar hacerlo con los ojos cerrados o parado sobre [place]. Sin calzado, prese cerca de una baranda o [place]. Puede sostenerse de la baranda o del marco de la puerta, segn lo necesite. Prese sobre el pie izquierdo/derecho. Sin despegar el dedo gordo del suelo, levante el arco del pie. Debe sentir un estiramiento en la parte de abajo del pie y el arco. No deje que el pie se vaya hacia adentro. Mantenga esta posicin durante __________ segundos. Repita __________ veces. Realice este ejercicio __________ veces al da. Esta informacin no tiene Theme park manager el consejo del mdico. Asegrese de hacerle al mdico cualquier pregunta que tenga. Document Revised: 12/05/2019 Document Reviewed: 12/05/2019 Elsevier Patient Education  2023 Elsevier Inc. Ejercicios de Gaffer Los ejercicios de manos pueden ser tiles para casi cualquier persona. Estos ejercicios pueden fortalecer las manos, mejorar la flexibilidad y [place], y aumentar el flujo de sangre a las [place]. Estos resultados pueden facilitar el trabajo y las tareas diarias. Los ejercicios de manos pueden ser especialmente provechosos para la gente que tiene dolor articular provocado por la artritis o tienen dao nervioso por uso excesivo (sndrome del tnel carpiano). Estos ejercicios tambin pueden ayudar a las personas que se lesionan [place]. Ejercicios La mayor parte de estos ejercicios de manos son suaves ejercicios de elongacin y [place]. Generalmente es seguro hacerlos durante todo Medical laboratory scientific officer. Calentarse las manos antes del ejercicio puede ayudar a reducir la rigidez. Puede hacer esto con un masaje suave o sumergiendo las manos en agua tibia durante 10 a 15 minutos. Es normal sentir Science writer,  tironeo, opresin o Management consultant al comenzar a Biochemist, clinical. Esto mejorar gradualmente. Suspenda un ejercicio de inmediato si siente un dolor intenso repentino o Community education officer. Consulte al mdico cules ejercicios son mejores para usted. Flexin de nudillos o puo "garra"  Prese o sintese con el brazo, la mano y los cinco dedos apuntando [place]. Asegrese de [employer] la mueca estirada durante el ejercicio. Flexione suavemente los dedos hacia abajo, hacia la palma de la mano, hasta que las puntas de los dedos toquen la parte superior de la palma de la [place]. Mantenga el nudillo grande estirado y solo flexione los nudillos pequeos en los dedos. Mantenga esta posicin durante __________ segundos. Estire (extienda) los dedos nuevamente hasta la posicin inicial. Repita este ejercicio entre 5 y 10 veces con cada mano. Puo con todo el dedo  Prese o sintese con el brazo, la mano y los cinco dedos apuntando [place]. Asegrese de [employer] la mueca estirada durante el ejercicio. Flexione suavemente los dedos [employer] palma de la mano, hasta que las puntas de los dedos toquen la parte media de la palma de la [place]. Mantenga esta posicin durante __________ segundos. Extienda los dedos nuevamente hasta la posicin inicial, estirando completamente cada articulacin. Repita este ejercicio entre 5 y 10 veces con cada mano. Puo extendido Prese o sintese con el brazo, la mano y los cinco dedos apuntando [place]. Asegrese de [employer]ark la mueca estirada durante el ejercicio. Flexione suavemente los dedos en el nudillo grande, donde los dedos se unen a la [employer], y en el [place]. Mantenga el nudillo de la punta de los dedos estirado y  trate de tocarse la parte inferior de la palma de la mano. Mantenga esta posicin durante __________ segundos. Extienda los dedos nuevamente hasta la posicin inicial, estirando completamente cada articulacin. Repita este ejercicio  entre 5 y 10 veces con cada mano. La mesa  Prese o sintese con el brazo, la mano y los cinco dedos apuntando [place]. Asegrese de [employer] la mueca estirada durante el ejercicio. Doble suavemente los dedos en el nudillo ms grande, donde los dedos se unen a la mano, lo ms lejos que pueda hacia abajo mientras [employer]els nudillos pequeos de los dedos estirados. Piense en formar una mesa con los dedos. Mantenga esta posicin durante __________ segundos. Extienda los dedos nuevamente hasta la posicin inicial, estirando completamente cada articulacin. Repita este ejercicio entre 5 y 10 veces con cada mano. Dispersin de dedos  Apoye la mano horizontal sobre una mesa con la palma [place]. Asegrese de que la mueca est estirada mientras hace este ejercicio. Separe todos los dedos uno de otro tanto como pueda hasta sentir un ligero estiramiento. Mantenga esta posicin durante __________ segundos. Vuelva a juntar todos los dedos. Mantenga esta posicin durante __________ segundos. Repita este ejercicio entre 5 y 10 veces con cada mano. Hacer crculos  Prese o sintese con el brazo, la mano y los cinco dedos apuntando [place]. Asegrese de [employer] la mueca estirada durante el ejercicio. Haga un crculo tocando la punta del pulgar con la punta del dedo ndice. Mantenga esta posicin durante __________ segundos. Luego abra la mano completamente. Repita este movimiento con Multimedia programmer y cada uno de los otros dedos de la [place]. Repita este ejercicio entre 5 y 10 veces con cada mano. Movimiento del pulgar  Sintese con el antebrazo apoyado sobre una mesa y la [place]. El pulgar debe apuntar hacia arriba, hacia el techo. Mantenga los otros dedos relajados mientras mueve el pulgar. [place]ante el pulgar hacia arriba lo ms alto que pueda hacia el techo. Mantenga esta posicin durante __________ segundos. Doble el pulgar por la palma lo ms lejos que pueda, tratando de llegar con  la punta del pulgar hasta el lado del dedo pequeo (meique) de la palma de la [place]. Mantenga esta posicin durante __________ segundos. Repita este ejercicio entre 5 y 10 veces con cada mano. Fortalecimiento del agarre  Mudlogger pelota para el estrs u otra pelota blanda en el medio de la [place]. Aumente lentamente la presin, apretando la pelota tanto como pueda sin Programmer, multimedia. Piense en llevar las puntas de los dedos hacia el centro de la palma. Todas las articulaciones de los dedos deben doblarse al hacer este ejercicio. Mantenga la presin durante __________ segundos y luego relaje. Repita este ejercicio entre 5 y 10 veces con cada mano. Comunquese con un mdico si: El dolor o las molestias en la mano se vuelven mucho ms intensas cuando hace un ejercicio. El dolor o las molestias en la mano no mejoran en el trmino de las 2 horas posteriores a Copy. Si tiene alguno de [employer] Brands, deje de [employer] ejercicios de inmediato. No vuelva a hacer los ejercicios a menos que el mdico lo autorice. Solicite ayuda de inmediato si: Presenta hinchazn o dolor sbito e intenso en la mano. Si esto ocurre, deje de Museum/gallery exhibitions officer ejercicios de inmediato. No vuelva a hacer los ejercicios a menos que el mdico lo autorice. Esta informacin no tiene Theme park manager el consejo del mdico. Asegrese de hacerle al mdico cualquier pregunta que tenga. Document  Revised: 05/27/2020 Document Reviewed: 05/27/2020 Elsevier Patient Education  2023 [employer].   Artrosis Osteoarthritis  La artrosis es un tipo de artritis. Esta afeccin produce dolor o enfermedad en las articulaciones. La artrosis afecta al tejido que cubre los extremos de los huesos en las articulaciones (cartlago). El cartlago acta como amortiguador AGCO Corporation y los ayuda a moverse con suavidad. La artrosis se presenta cuando el cartlago de las articulaciones se gasta. A veces, la artrosis se denomina artritis  "por uso y desgaste". La artrosis es la forma ms frecuente de artritis. A menudo, afecta a las Smith International. Es una afeccin que empeora con el [place]. Las articulaciones afectadas con mayor frecuencia por esta afeccin se encuentran en los dedos de Washington [employer], los dedos de [employer]ack, las caderas, las rodillas y la columna vertebral, incluyendo el cuello y la parte inferior de la espalda. Cules son las causas? Esta afeccin es causada por el desgaste del cartlago que cubre los extremos de [place]. Qu incrementa el riesgo? Los siguientes factores pueden hacer que sea ms propenso a Aeronautical engineer afeccin: Ser mayor de 50 aos. Obesidad. Uso excesivo de Nurse, learning disability. Lesin pasada de Risk analyst. Ciruga pasada en una articulacin. Antecedentes familiares de artrosis. Cules son los signos o sntomas? Los principales sntomas de esta enfermedad son dolor, hinchazn y Geophysical data processor. Otros sntomas pueden incluir: Agrandamiento de Nurse, learning disability. Aumento del dolor y dao adicional causado por pequeos trozos de Dow Chemical o TEFL teacher que se desprenden y flotan dentro de Nurse, learning disability. Formacin de pequeos depsitos de hueso (osteofitos) en los extremos de Nurse, learning disability. Una sensacin de chirrido o raspado dentro de la articulacin al moverla. Sonidos de chasquido o crujido al [employer] Company. Dificultad para caminar o hacer ejercicio. Incapacidad para agarrar objetos, girar la mano o controlar los movimientos de las manos y los dedos. Cmo se diagnostica? Esta afeccin se puede diagnosticar en funcin de lo siguiente: Sus antecedentes mdicos. Un examen fsico. Los sntomas. Radiografas de las articulaciones afectadas. Anlisis de sangre para descartar otros tipos de artritis. Cmo se trata? No hay cura para esta enfermedad, pero el tratamiento puede ayudar a Human resources officer y Scientist, clinical (histocompatibility and immunogenetics) el funcionamiento de Nurse, learning disability. El tratamiento puede incluir una  combinacin de terapias, [place] las siguientes: Tcnicas de alivio del dolor, como: Aplicacin de calor y fro en la articulacin. Masajes. Una forma de psicoterapia llamada terapia cognitivo conductual (TCC). Esta terapia le ayuda a Consulting civil engineer y a Education officer, environmental un seguimiento de los cambios que hace. Analgsicos y antiinflamatorios. Los medicamentos pueden tomarse por boca o [employer] Corporation. Incluyen los siguientes: Antiinflamatorios no esteroideos (AINE), como el ibuprofeno. Medicamentos recetados. Antiinflamatorios fuertes (corticoesteroides). Ciertos suplementos nutricionales. Un programa de ejercicios recomendado. Puede trabajar con un fisioterapeuta. Dispositivos de [place], como un dispositivo ortopdico, una frula, un guante especial o un bastn. Un plan de control del peso. [place], como: Carolin Sicks. Se hace para volver a posicionar los [employer]e y Engineer, materials o para Oceanographer los trozos sueltos de hueso y TEFL teacher. Ciruga de reemplazo articular. Es posible que necesite esta ciruga si tiene una artrosis [place]. Siga estas indicaciones en su casa: Actividad Descanse las articulaciones afectadas como se lo haya indicado el mdico. Haga actividad fsica como se lo haya indicado el mdico. El mdico puede recomendar tipos especficos de ejercicios, por ejemplo: Ejercicios de fortalecimiento. Se realizan para fortalecer los msculos que sostienen las articulaciones afectadas por la artritis. Ejercicios aerbicos. Son ejercicios, como caminar a  paso ligero o hacer gimnasia [place] acutica, que aumentan la frecuencia cardaca. Actividades de amplitud de movimientos. Estos ayudan a que las articulaciones se muevan con ms facilidad. Ejercicios de equilibrio y [place]. Control del dolor, la rigidez y la hinchazn     Si se lo indican, aplique calor en la zona afectada con la frecuencia que le haya dicho el mdico. Use la fuente de calor que el mdico le recomiende,  como una compresa de calor hmedo o una almohadilla trmica. Si tiene un dispositivo de [employer] K[employer] se puede quitar, Smith International segn lo indicado por su mdico. Coloque una toalla entre la piel y la fuente de Airline pilot. Si el mdico le indica que no se quite el dispositivo de USAA se Tax inspector, coloque una toalla entre el dispositivo de [place] y la fuente de Airline pilot. Aplique calor durante 20 a 30 minutos. Si se lo indican, aplique hielo en la zona afectada. Si tiene un dispositivo de ayuda que se puede quitar, Smith International segn lo indicado por su mdico. Ponga el hielo en una bolsa plstica. Coloque una toalla entre la piel y Copy. Si el mdico le indica que no se quite el dispositivo de USAA se aplica hielo, coloque una toalla entre el dispositivo de [place] y la bolsa de hielo. Aplique el hielo durante 20 minutos, 2 o 3 veces por da. Si la piel se le pone de color rojo brillante, retire el hielo o Company secretary de inmediato para evitar daos en la piel. El Seltzer de dao es mayor si no puede sentir dolor, Airline pilot o fro. Mueva los dedos de las manos o de los pies con frecuencia para reducir la rigidez y la hinchazn. Cuando est sentado o acostado, levante (eleve) la zona afectada por encima del nivel del corazn. Indicaciones generales Use los medicamentos de venta libre y los recetados solamente como se lo haya indicado el mdico. Mantenga un peso saludable. Siga las instrucciones del mdico con respecto al control del [place]. No consuma ningn producto que contenga nicotina o tabaco. Estos productos incluyen cigarrillos, tabaco para Theatre manager y aparatos de vapeo, como los Administrator, Civil Service. Si necesita ayuda para dejar de consumir estos productos, consulte al American Express. Use los dispositivos de ayuda como se lo haya indicado el mdico. Dnde buscar ms informacin [employer] Mills of Arthritis and Musculoskeletal and Skin Diseases (Instituto [employer] de Artritis y [place]  Musculoesquelticas y Arboriculturist): niams.http://www.myers.net/ General Mills on Aging Lawyer sobre el Envejecimiento): BaseRingTones.pl Celanese Corporation of Rheumatology (Instituto Estadounidense de Reumatologa): rheumatology.org Comunquese con un mdico si: Tiene enrojecimiento, hinchazn o sensacin de calor que empeora en una articulacin. Tiene fiebre y siente dolor en la articulacin o el msculo. Presenta una erupcin cutnea. Tiene dificultad para Xcel Energy cotidianas. Siente dolor que empeora y no se alivia con los analgsicos. Esta informacin no tiene Theme park manager el consejo del mdico. Asegrese de hacerle al mdico cualquier pregunta que tenga. Document Revised: 10/25/2021 Document Reviewed: 10/25/2021 Elsevier Patient Education  2023 ArvinMeritor.

## 2022-11-21 ENCOUNTER — Encounter (INDEPENDENT_AMBULATORY_CARE_PROVIDER_SITE_OTHER): Payer: 59 | Admitting: Primary Care

## 2022-11-21 ENCOUNTER — Encounter (INDEPENDENT_AMBULATORY_CARE_PROVIDER_SITE_OTHER): Payer: Self-pay

## 2022-11-21 NOTE — Progress Notes (Signed)
  [facility]  Patricia Mitchell, is a 62 y.o. female  WGN:[id number]  HQI:[id number]  DOB - [birth year]  No chief complaint on file.      Subjective:   Patricia Mitchell is a 62 y.o. female here today for a follow up visit. Patient has No headache, No chest pain, No abdominal pain - No Nausea, No new weakness tingling or numbness, No Cough - shortness of breath  No problems updated.  No Known Allergies  Past Medical History:  Diagnosis Date   Endometriosis of ovary    Hyperlipidemia    Osteopenia    Osteoporosis     Current Outpatient Medications on File Prior to Visit  Medication Sig Dispense Refill   albuterol (VENTOLIN HFA) 108 (90 Base) MCG/ACT inhaler Inhale 1-2 puffs into the lungs every 6 (six) hours as needed for wheezing or shortness of breath. (Patient not taking: Reported on 05/08/2022) 18 g 0   atorvastatin (LIPITOR) 40 MG tablet Take 40 mg by mouth daily. (Patient not taking: Reported on 06/08/2022)     Calcium Carb-Cholecalciferol (CALCIUM + D3 PO) Take by mouth daily.     Misc Natural Products (JOINT SUPPORT PO) Take 2 capsules by mouth daily.     Multiple Vitamin (MULTI-VITAMIN) tablet Take 1 tablet by mouth daily.     vitamin E 200 UNIT capsule Take 200 Units by mouth daily.     No current facility-administered medications on file prior to visit.    Objective:  There were no vitals filed for this visit.  Comprehensive ROS Pertinent positive and negative noted in HPI   Exam General appearance : Awake, alert, not in any distress. Speech Clear. Not toxic looking HEENT: Atraumatic and Normocephalic, pupils equally reactive to light and accomodation Neck: Supple, no JVD. No cervical lymphadenopathy.  Chest: Good air entry bilaterally, no added sounds  CVS: S1 S2 regular, no murmurs.  Abdomen: Bowel sounds present, Non tender and not distended with no gaurding, rigidity or rebound. Extremities: B/L Lower Ext shows no edema, both legs are warm to  touch Neurology: Awake alert, and oriented X 3, CN II-XII intact, Non focal Skin: No Rash  Data Review Lab Results  Component Value Date   HGBA1C 5.8 (H) 05/22/2022   HGBA1C 6.1 09/13/2020    Assessment & Plan   There are no diagnoses linked to this encounter. There are no diagnoses linked to this encounter.   Patient have been counseled extensively about nutrition and exercise. Other issues discussed during this visit include: low cholesterol diet, weight control and daily exercise, foot care, annual eye examinations at Ophthalmology, importance of adherence with medications and regular follow-up. We also discussed long term complications of uncontrolled diabetes and hypertension.   No follow-ups on file.  The patient was given clear instructions to go to ER or return to medical center if symptoms don't improve, worsen or new problems develop. The patient verbalized understanding. The patient was told to call to get lab results if they haven't heard anything in the next week.   This note has been created with Education officer, environmental. Any transcriptional errors are unintentional.   [name], NP 11/21/2022, 3:02 PM

## 2022-12-02 ENCOUNTER — Ambulatory Visit (HOSPITAL_COMMUNITY)
Admission: EM | Admit: 2022-12-02 | Discharge: 2022-12-02 | Disposition: A | Discharge status: Home / Self Care | Payer: 59 | Attending: Family Medicine | Admitting: Family Medicine

## 2022-12-02 ENCOUNTER — Encounter (HOSPITAL_COMMUNITY): Payer: Self-pay | Admitting: Emergency Medicine

## 2022-12-02 DIAGNOSIS — J069 Acute upper respiratory infection, unspecified: Secondary | ICD-10-CM | POA: Diagnosis not present

## 2022-12-02 DIAGNOSIS — H9203 Otalgia, bilateral: Secondary | ICD-10-CM

## 2022-12-02 DIAGNOSIS — J029 Acute pharyngitis, unspecified: Secondary | ICD-10-CM | POA: Diagnosis not present

## 2022-12-02 LAB — POC COVID19/FLU A&B COMBO
Covid Antigen, POC: NEGATIVE
Influenza A Antigen, POC: NEGATIVE
Influenza B Antigen, POC: NEGATIVE

## 2022-12-02 LAB — POCT RAPID STREP A (OFFICE): Rapid Strep A Screen: NEGATIVE

## 2022-12-02 MED ORDER — PROMETHAZINE-DM 6.25-15 MG/5ML PO SYRP: 5.0000 mL | ORAL_SOLUTION | Freq: Three times a day (TID) | ORAL | 0 refills | Status: DC | PRN

## 2022-12-02 MED ORDER — IBUPROFEN 400 MG PO TABS: 400.0000 mg | ORAL_TABLET | Freq: Four times a day (QID) | ORAL | 0 refills | Status: AC | PRN

## 2022-12-02 NOTE — ED Provider Notes (Signed)
[facility]    CSN: [id number] Arrival date & time: 12/02/22  1007      History   Chief Complaint Chief Complaint  Patient presents with   Cough   Otalgia    HPI Kresha Campion is a 62 y.o. female.   The history is provided by the patient. No language interpreter was used.  Cough Cough characteristics:  Productive Sputum characteristics:  Yellow Severity:  Moderate Onset quality:  Gradual Duration:  5 days Timing:  Constant Progression:  Unchanged Chronicity:  New Smoker: no   Context: not sick contacts and not smoke exposure   Ineffective treatments:  None tried Associated symptoms: ear pain, sinus congestion and sore throat   Associated symptoms: no fever, no shortness of breath and no wheezing   Associated symptoms comment:  Chest hurts with coughing a lot. Also c/o mild B/L ear pain. Throat pain x 5 days Otalgia Associated symptoms: cough and sore throat   Associated symptoms: no fever     Past Medical History:  Diagnosis Date   Endometriosis of ovary    Hyperlipidemia    Osteopenia    Osteoporosis     Patient Active Problem List   Diagnosis Date Noted   Age-related cataract of both eyes 01/24/2022   Chronic night sweats 01/24/2022   Adenomatous polyp 05/23/2013   Osteopenia 10/19/2011   Premature surgical menopause 10/19/2011   Hyperlipidemia 07/11/2011   Constipation 07/11/2011    Past Surgical History:  Procedure Laterality Date   CYST REMOVAL HAND Bilateral 02/07/2008   OOPHORECTOMY  1995, 1997   right taken out in 1995, left taken out in 1997 b/c of cysts   SUPRACERVICAL ABDOMINAL HYSTERECTOMY  2000    OB History     Gravida  0   Para  0   Term  0   Preterm  0   AB  0   Living  0      SAB  0   IAB  0   Ectopic  0   Multiple  0   Live Births  0            Home Medications    Prior to Admission medications   Medication Sig Start Date End Date Taking? Authorizing Provider  ibuprofen (ADVIL) 400 MG  tablet Take 1 tablet (400 mg total) by mouth every 6 (six) hours as needed for up to 14 days. 12/02/22 12/16/22 Yes Doreene Eland, MD  promethazine-dextromethorphan (PROMETHAZINE-DM) 6.25-15 MG/5ML syrup Take 5 mLs by mouth every 8 (eight) hours as needed for cough. 12/02/22  Yes Doreene Eland, MD  albuterol (VENTOLIN HFA) 108 (90 Base) MCG/ACT inhaler Inhale 1-2 puffs into the lungs every 6 (six) hours as needed for wheezing or shortness of breath. Patient not taking: Reported on 05/08/2022 12/24/21   Raspet, Denny Peon K, PA-C  atorvastatin (LIPITOR) 40 MG tablet Take 40 mg by mouth daily. Patient not taking: Reported on 06/08/2022    [provider]  Calcium Carb-Cholecalciferol (CALCIUM + D3 PO) Take by mouth daily.    [provider]  Misc Natural Products (JOINT SUPPORT PO) Take 2 capsules by mouth daily.    [provider]  Multiple Vitamin (MULTI-VITAMIN) tablet Take 1 tablet by mouth daily. 07/11/18   [provider]  vitamin E 200 UNIT capsule Take 200 Units by mouth daily.    [provider]    Family History Family History  Problem Relation Age of Onset   Prostate cancer Father  Cervical cancer Mother 34       dec   Colon cancer Neg Hx     Social History Social History   Tobacco Use   Smoking status: Never    Passive exposure: Never   Smokeless tobacco: Never  Vaping Use   Vaping status: Never Used  Substance Use Topics   Alcohol use: No   Drug use: No     Allergies   Patient has no known allergies.   Review of Systems Review of Systems  Constitutional:  Negative for fever.  HENT:  Positive for ear pain and sore throat.   Respiratory:  Positive for cough. Negative for shortness of breath and wheezing.      Physical Exam Triage Vital Signs ED Triage Vitals [12/02/22 1124]  Encounter Vitals Group     BP      Systolic BP Percentile      Diastolic BP Percentile      Pulse      Resp      Temp      Temp src       SpO2      Weight      Height      Head Circumference      Peak Flow      Pain Score 4     Pain Loc      Pain Education      Exclude from Growth Chart    No data found.  Updated Vital Signs BP 125/78 (BP Location: Left Arm)   Pulse 85   Temp 98.3 F (36.8 C) (Oral)   Resp 17   SpO2 96%   Visual Acuity Right Eye Distance:   Left Eye Distance:   Bilateral Distance:    Right Eye Near:   Left Eye Near:    Bilateral Near:     Physical Exam Constitutional:      General: She is not in acute distress.    Appearance: She is not ill-appearing or toxic-appearing.  HENT:     Right Ear: Tympanic membrane and ear canal normal.     Left Ear: Tympanic membrane and ear canal normal.     Mouth/Throat:     Mouth: Mucous membranes are moist.     Pharynx: No oropharyngeal exudate or posterior oropharyngeal erythema.  Eyes:     General:        Right eye: No discharge.        Left eye: No discharge.     Extraocular Movements: Extraocular movements intact.     Pupils: Pupils are equal, round, and reactive to light.  Cardiovascular:     Rate and Rhythm: Normal rate and regular rhythm.     Heart sounds: Normal heart sounds. No murmur heard. Pulmonary:     Effort: Pulmonary effort is normal. No respiratory distress.     Breath sounds: Normal breath sounds. No wheezing.  Neurological:     Mental Status: She is alert.      UC Treatments / Results  Labs (all labs ordered are listed, but only abnormal results are displayed) Labs Reviewed  POC COVID19/FLU A&B COMBO  POCT RAPID STREP A (OFFICE)    EKG   Radiology No results found.  Procedures Procedures (including critical care time)  Medications Ordered in UC Medications - No data to display  Initial Impression / Assessment and Plan / UC Course  I have reviewed the triage vital signs and the nursing notes.  Pertinent labs & imaging results that were  available during my care of the patient were reviewed by me and  considered in my medical decision making (see chart for details).  Clinical Course as of 12/02/22 1157  Sat Dec 02, 2022  1154 URI/Pharyngitis/Otalgia Negative rapid strep, COVID-19 and influenzer testing Likely a simple viral illness Ibuprofen prn pain was prescribed, as well as Promethazine DM prn cough Return precautions discussed.  [KE]    Clinical Course User Index [KE] Doreene Eland, MD    Final Clinical Impressions(s) / UC Diagnoses   Final diagnoses:  Viral upper respiratory tract infection  Otalgia of both ears  Acute pharyngitis, unspecified etiology     Discharge Instructions      It was nice seeing you today. I am sorry about your cold symptoms. You likely have a viral illness. Thankfully, your strep test, COVID-19 test, and influenza test are negative. Please use Ibuprofen as needed for pain and follow up with [place] soon if your symptoms do not improve. I sent Ibuprofen and cough medication to your pharmacy.     ED Prescriptions     Medication Sig Dispense Auth. Provider   ibuprofen (ADVIL) 400 MG tablet Take 1 tablet (400 mg total) by mouth every 6 (six) hours as needed for up to 14 days. 30 tablet Janit Pagan T, MD   promethazine-dextromethorphan (PROMETHAZINE-DM) 6.25-15 MG/5ML syrup Take 5 mLs by mouth every 8 (eight) hours as needed for cough. 118 mL Doreene Eland, MD      PDMP not reviewed this encounter.   Doreene Eland, MD 12/02/22 [phone]

## 2022-12-02 NOTE — Discharge Instructions (Addendum)
It was nice seeing you today. I am sorry about your cold symptoms. You likely have a viral illness. Thankfully, your strep test, COVID-19 test, and influenza test are negative. Please use Ibuprofen as needed for pain and follow up with [place] soon if your symptoms do not improve. I sent Ibuprofen and cough medication to your pharmacy.

## 2022-12-02 NOTE — ED Triage Notes (Signed)
Pt c/o cough and ear pain for 5 days. Hasn't taken any medications for symptoms.

## 2023-06-27 ENCOUNTER — Encounter: Payer: Self-pay | Admitting: Internal Medicine

## 2023-06-27 ENCOUNTER — Ambulatory Visit (INDEPENDENT_AMBULATORY_CARE_PROVIDER_SITE_OTHER): Payer: 59 | Admitting: Internal Medicine

## 2023-06-27 VITALS — BP 130/80 | HR 74 | Temp 98.3°F | Ht 62.99 in | Wt 143.3 lb

## 2023-06-27 DIAGNOSIS — Z23 Encounter for immunization: Secondary | ICD-10-CM

## 2023-06-27 DIAGNOSIS — Z1231 Encounter for screening mammogram for malignant neoplasm of breast: Secondary | ICD-10-CM

## 2023-06-27 DIAGNOSIS — Z780 Asymptomatic menopausal state: Secondary | ICD-10-CM

## 2023-06-27 DIAGNOSIS — Z124 Encounter for screening for malignant neoplasm of cervix: Secondary | ICD-10-CM

## 2023-06-27 DIAGNOSIS — E782 Mixed hyperlipidemia: Secondary | ICD-10-CM | POA: Diagnosis not present

## 2023-06-27 NOTE — Progress Notes (Signed)
 New Patient Office Visit     CC/Reason for Visit: Establish care, discuss chronic medical conditions Previous PCP: Madelyn Schick, NP Last Visit: 2024  HPI: Patricia Mitchell is a 63 y.o. female who is coming in today for the above mentioned reasons. Past Medical History is significant for: Hyperlipidemia.  She had hysterectomy and BSO in her 30s due to endometriosis.  She was on hormone replacement therapy for a while but has been off it for about 10 years or so.  She has been having significant hot flashes.  She was diagnosed with osteopenia 2 years ago.  She is overdue for mammogram, had a colonoscopy in 2020.  She does not smoke or drink.  She is due for shingles vaccination.   Past Medical/Surgical History: Past Medical History:  Diagnosis Date   Endometriosis of ovary    Hyperlipidemia    Osteopenia    Osteoporosis     Past Surgical History:  Procedure Laterality Date   CYST REMOVAL HAND Bilateral 02/07/2008   OOPHORECTOMY  1995, 1997   right taken out in 1995, left taken out in 1997 b/c of cysts   SUPRACERVICAL ABDOMINAL HYSTERECTOMY  2000    Social History:  reports that she has never smoked. She has never been exposed to tobacco smoke. She has never used smokeless tobacco. She reports that she does not drink alcohol and does not use drugs.  Allergies: No Known Allergies  Family History:  Family History  Problem Relation Age of Onset   Prostate cancer Father    Cervical cancer Mother 87       dec   Colon cancer Neg Hx      Current Outpatient Medications:    albuterol  (VENTOLIN  HFA) 108 (90 Base) MCG/ACT inhaler, Inhale 1-2 puffs into the lungs every 6 (six) hours as needed for wheezing or shortness of breath., Disp: 18 g, Rfl: 0   atorvastatin  (LIPITOR) 40 MG tablet, Take 40 mg by mouth daily., Disp: , Rfl:    Calcium  Carb-Cholecalciferol (CALCIUM  + D3 PO), Take by mouth daily., Disp: , Rfl:    Misc Natural Products (JOINT SUPPORT PO), Take 2 capsules by  mouth daily., Disp: , Rfl:    Multiple Vitamin (MULTI-VITAMIN) tablet, Take 1 tablet by mouth daily., Disp: , Rfl:    vitamin E 200 UNIT capsule, Take 200 Units by mouth daily., Disp: , Rfl:   Review of Systems:  Negative except as indicated in HPI.   Physical Exam: Vitals:   06/27/23 1512  BP: 130/80  Pulse: 74  Temp: 98.3 F (36.8 C)  TempSrc: Oral  SpO2: 98%  Weight: 143 lb 4.8 oz (65 kg)  Height: 5' 2.99" (1.6 m)   Body mass index is 25.39 kg/m.  Physical Exam Vitals reviewed.  Constitutional:      Appearance: Normal appearance.  HENT:     Head: Normocephalic and atraumatic.  Eyes:     Conjunctiva/sclera: Conjunctivae normal.  Cardiovascular:     Rate and Rhythm: Normal rate and regular rhythm.  Pulmonary:     Effort: Pulmonary effort is normal.     Breath sounds: Normal breath sounds.  Skin:    General: Skin is warm and dry.  Neurological:     General: No focal deficit present.     Mental Status: She is alert and oriented to person, place, and time.  Psychiatric:        Mood and Affect: Mood normal.        Behavior: Behavior normal.  Thought Content: Thought content normal.        Judgment: Judgment normal.       Impression and Plan:  Screening for cervical cancer -     Ambulatory referral to Gynecology  Encounter for screening mammogram for malignant neoplasm of breast -     Digital Screening Mammogram, Left and Right; Future  Immunization due  Mixed hyperlipidemia  Postmenopausal estrogen deficiency -     DG Bone Density; Future   - Placed referrals to GYN, mammogram, DEXA. - Given early surgical menopause, I think it would be beneficial for her to go back on hormone replacement therapy.  She will discuss with GYN. - Check lipids with next CPE. - First shingles vaccine administered in office today.  Time spent: 35 minutes reviewing chart, interviewing and examining patient and formulating plan of care.     Marguerita Shih, MD [facility]

## 2023-07-04 ENCOUNTER — Ambulatory Visit (INDEPENDENT_AMBULATORY_CARE_PROVIDER_SITE_OTHER): Admitting: Internal Medicine

## 2023-07-04 ENCOUNTER — Encounter: Payer: Self-pay | Admitting: Internal Medicine

## 2023-07-04 VITALS — BP 120/70 | HR 65 | Temp 97.6°F | Ht 63.5 in | Wt 140.4 lb

## 2023-07-04 DIAGNOSIS — E559 Vitamin D deficiency, unspecified: Secondary | ICD-10-CM | POA: Diagnosis not present

## 2023-07-04 DIAGNOSIS — Z780 Asymptomatic menopausal state: Secondary | ICD-10-CM

## 2023-07-04 DIAGNOSIS — E782 Mixed hyperlipidemia: Secondary | ICD-10-CM

## 2023-07-04 DIAGNOSIS — Z23 Encounter for immunization: Secondary | ICD-10-CM

## 2023-07-04 DIAGNOSIS — R7303 Prediabetes: Secondary | ICD-10-CM

## 2023-07-04 DIAGNOSIS — Z1231 Encounter for screening mammogram for malignant neoplasm of breast: Secondary | ICD-10-CM

## 2023-07-04 DIAGNOSIS — Z0000 Encounter for general adult medical examination without abnormal findings: Secondary | ICD-10-CM

## 2023-07-04 DIAGNOSIS — Z13820 Encounter for screening for osteoporosis: Secondary | ICD-10-CM

## 2023-07-04 LAB — COMPREHENSIVE METABOLIC PANEL WITH GFR
ALT: 14 U/L (ref 0–35)
AST: 16 U/L (ref 0–37)
Albumin: 4.3 g/dL (ref 3.5–5.2)
Alkaline Phosphatase: 85 U/L (ref 39–117)
BUN: 17 mg/dL (ref 6–23)
CO2: 29 meq/L (ref 19–32)
Calcium: 9.2 mg/dL (ref 8.4–10.5)
Chloride: 104 meq/L (ref 96–112)
Creatinine, Ser: 0.71 mg/dL (ref 0.40–1.20)
GFR: 90.59 mL/min (ref >60.00)
Glucose, Bld: 92 mg/dL (ref 70–99)
Potassium: 3.6 meq/L (ref 3.5–5.1)
Sodium: 140 meq/L (ref 135–145)
Total Bilirubin: 0.5 mg/dL (ref 0.2–1.2)
Total Protein: 6.7 g/dL (ref 6.0–8.3)

## 2023-07-04 LAB — CBC WITH DIFFERENTIAL/PLATELET
Basophils Absolute: 0 10*3/uL (ref 0.0–0.1)
Basophils Relative: 0.7 % (ref 0.0–3.0)
Eosinophils Absolute: 0 10*3/uL (ref 0.0–0.7)
Eosinophils Relative: 1 % (ref 0.0–5.0)
HCT: 40.5 % (ref 36.0–46.0)
Hemoglobin: 13.8 g/dL (ref 12.0–15.0)
Lymphocytes Relative: 32.7 % (ref 12.0–46.0)
Lymphs Abs: 1.5 10*3/uL (ref 0.7–4.0)
MCHC: 34 g/dL (ref 30.0–36.0)
MCV: 95.2 fl (ref 78.0–100.0)
Monocytes Absolute: 0.3 10*3/uL (ref 0.1–1.0)
Monocytes Relative: 6.2 % (ref 3.0–12.0)
Neutro Abs: 2.7 10*3/uL (ref 1.4–7.7)
Neutrophils Relative %: 59.4 % (ref 43.0–77.0)
Platelets: 195 10*3/uL (ref 150.0–400.0)
RBC: 4.25 Mil/uL (ref 3.87–5.11)
RDW: 11.6 % (ref 11.5–15.5)
WBC: 4.6 10*3/uL (ref 4.0–10.5)

## 2023-07-04 LAB — LIPID PANEL
Cholesterol: 238 mg/dL — ABNORMAL HIGH (ref 0–200)
HDL: 51.5 mg/dL (ref >39.00)
LDL Cholesterol: 155 mg/dL — ABNORMAL HIGH (ref 0–99)
NonHDL: 186.19
Total CHOL/HDL Ratio: 5
Triglycerides: 154 mg/dL — ABNORMAL HIGH (ref 0.0–149.0)
VLDL: 30.8 mg/dL (ref 0.0–40.0)

## 2023-07-04 LAB — VITAMIN B12: Vitamin B-12: 790 pg/mL (ref 211–911)

## 2023-07-04 LAB — HEMOGLOBIN A1C: Hgb A1c MFr Bld: 5.5 % (ref 4.6–6.5)

## 2023-07-04 LAB — TSH: TSH: 2.3 u[IU]/mL (ref 0.35–5.50)

## 2023-07-04 LAB — VITAMIN D 25 HYDROXY (VIT D DEFICIENCY, FRACTURES): VITD: 33.13 ng/mL (ref 30.00–100.00)

## 2023-07-04 NOTE — Progress Notes (Signed)
 Established Patient Office Visit     CC/Reason for Visit: Annual preventive exam  HPI: Patricia Mitchell is a 63 y.o. female who is coming in today for the above mentioned reasons. Past Medical History is significant for: Hyperlipidemia.  No acute concerns or complaints.  She is due for pneumonia vaccination, bone density, mammogram and cervical cancer screening.   Past Medical/Surgical History: Past Medical History:  Diagnosis Date   Endometriosis of ovary    Hyperlipidemia    Osteopenia    Osteoporosis     Past Surgical History:  Procedure Laterality Date   CYST REMOVAL HAND Bilateral 02/07/2008   OOPHORECTOMY  1995, 1997   right taken out in 1995, left taken out in 1997 b/c of cysts   SUPRACERVICAL ABDOMINAL HYSTERECTOMY  2000    Social History:  reports that she has never smoked. She has never been exposed to tobacco smoke. She has never used smokeless tobacco. She reports that she does not drink alcohol and does not use drugs.  Allergies: No Known Allergies  Family History:  Family History  Problem Relation Age of Onset   Prostate cancer Father    Cervical cancer Mother 19       dec   Colon cancer Neg Hx      Current Outpatient Medications:    Calcium  Carb-Cholecalciferol (CALCIUM  + D3 PO), Take by mouth daily., Disp: , Rfl:    Misc Natural Products (JOINT SUPPORT PO), Take 2 capsules by mouth daily., Disp: , Rfl:    Multiple Vitamin (MULTI-VITAMIN) tablet, Take 1 tablet by mouth daily., Disp: , Rfl:    vitamin E 200 UNIT capsule, Take 200 Units by mouth daily., Disp: , Rfl:    albuterol  (VENTOLIN  HFA) 108 (90 Base) MCG/ACT inhaler, Inhale 1-2 puffs into the lungs every 6 (six) hours as needed for wheezing or shortness of breath. (Patient not taking: Reported on 07/04/2023), Disp: 18 g, Rfl: 0   atorvastatin  (LIPITOR) 40 MG tablet, Take 40 mg by mouth daily. (Patient not taking: Reported on 07/04/2023), Disp: , Rfl:   Review of Systems:  Negative unless  indicated in HPI.   Physical Exam: Vitals:   07/04/23 0810  BP: 120/70  Pulse: 65  Temp: 97.6 F (36.4 C)  TempSrc: Oral  SpO2: 98%  Weight: 140 lb 6.4 oz (63.7 kg)  Height: 5' 3.5" (1.613 m)    Body mass index is 24.48 kg/m.   Physical Exam Vitals reviewed.  Constitutional:      General: She is not in acute distress.    Appearance: Normal appearance. She is not ill-appearing, toxic-appearing or diaphoretic.  HENT:     Head: Normocephalic.     Right Ear: Tympanic membrane, ear canal and external ear normal. There is no impacted cerumen.     Left Ear: Tympanic membrane, ear canal and external ear normal. There is no impacted cerumen.     Nose: Nose normal.     Mouth/Throat:     Mouth: Mucous membranes are moist.     Pharynx: Oropharynx is clear. No oropharyngeal exudate or posterior oropharyngeal erythema.  Eyes:     General: No scleral icterus.       Right eye: No discharge.        Left eye: No discharge.     Conjunctiva/sclera: Conjunctivae normal.     Pupils: Pupils are equal, round, and reactive to light.  Neck:     Vascular: No carotid bruit.  Cardiovascular:     Rate  and Rhythm: Normal rate and regular rhythm.     Pulses: Normal pulses.     Heart sounds: Normal heart sounds.  Pulmonary:     Effort: Pulmonary effort is normal. No respiratory distress.     Breath sounds: Normal breath sounds.  Abdominal:     General: Abdomen is flat. Bowel sounds are normal.     Palpations: Abdomen is soft.  Musculoskeletal:        General: Normal range of motion.     Cervical back: Normal range of motion.  Skin:    General: Skin is warm and dry.  Neurological:     General: No focal deficit present.     Mental Status: She is alert and oriented to person, place, and time. Mental status is at baseline.  Psychiatric:        Mood and Affect: Mood normal.        Behavior: Behavior normal.        Thought Content: Thought content normal.        Judgment: Judgment normal.      Flowsheet Row Office Visit from 06/27/2023 in [facility] [place]  PHQ-9 Total Score 5        Impression and Plan:  Encounter for preventive health examination  Mixed hyperlipidemia -     Lipid panel; Future  Postmenopausal estrogen deficiency  Prediabetes -     CBC with Differential/Platelet; Future -     Comprehensive metabolic panel with GFR; Future -     TSH; Future -     Vitamin B12; Future -     Hemoglobin A1c; Future  Vitamin D  deficiency -     VITAMIN D  25 Hydroxy (Vit-D Deficiency, Fractures); Future  Encounter for screening mammogram for malignant neoplasm of breast  Encounter for osteoporosis screening in asymptomatic postmenopausal patient  Immunization due   -Recommend routine eye and dental care. -Healthy lifestyle discussed in detail. -Labs to be updated today. -Prostate cancer screening: Not applicable Health Maintenance  Topic Date Due   COVID-19 Vaccine (1) Never done   Zoster (Shingles) Vaccine (2 of 2) 08/22/2023   Flu Shot  09/07/2023   Mammogram  09/09/2023   Colon Cancer Screening  12/30/2023   Pap with HPV screening  12/28/2025   DTaP/Tdap/Td vaccine (4 - Td or Tdap) 06/26/2032   Hepatitis C Screening  Completed   HIV Screening  Completed   HPV Vaccine  Aged Out   Meningitis B Vaccine  Aged Out     -PCV 20 administered in office today. - Appropriate referrals placed for health maintenance.    Marguerita Shih, MD [facility]

## 2023-07-04 NOTE — Addendum Note (Signed)
 Addended by: Nicolina Barrios B on: 07/04/2023 08:58 AM   Modules accepted: Orders

## 2023-07-05 ENCOUNTER — Ambulatory Visit: Payer: Self-pay | Admitting: Internal Medicine

## 2023-07-05 DIAGNOSIS — E782 Mixed hyperlipidemia: Secondary | ICD-10-CM

## 2023-07-05 MED ORDER — ROSUVASTATIN CALCIUM 5 MG PO TABS: 5.0000 mg | ORAL_TABLET | Freq: Every day | ORAL | 1 refills | Status: AC

## 2023-07-31 LAB — HM MAMMOGRAPHY

## 2023-08-01 ENCOUNTER — Encounter: Payer: Self-pay | Admitting: Internal Medicine

## 2023-08-22 ENCOUNTER — Ambulatory Visit (INDEPENDENT_AMBULATORY_CARE_PROVIDER_SITE_OTHER): Admitting: Radiology

## 2023-08-22 ENCOUNTER — Encounter: Payer: Self-pay | Admitting: Radiology

## 2023-08-22 VITALS — BP 112/60 | HR 71 | Ht 63.19 in | Wt 143.4 lb

## 2023-08-22 DIAGNOSIS — Z1331 Encounter for screening for depression: Secondary | ICD-10-CM | POA: Diagnosis not present

## 2023-08-22 DIAGNOSIS — Z01419 Encounter for gynecological examination (general) (routine) without abnormal findings: Secondary | ICD-10-CM | POA: Diagnosis not present

## 2023-08-22 DIAGNOSIS — N951 Menopausal and female climacteric states: Secondary | ICD-10-CM | POA: Diagnosis not present

## 2023-08-22 MED ORDER — ESTRADIOL 0.05 MG/24HR TD PTTW: 1.0000 | MEDICATED_PATCH | TRANSDERMAL | 4 refills | Status: DC

## 2023-08-22 NOTE — Progress Notes (Signed)
   Patricia Mitchell May 21, 1960 [id number]   History:  63 y.o. G0 presents for annual exam with Spanish interpreter. Would like to restart ERT patches, hot flashes have come back, has osteopenia and hyperlipidemia.   Gynecologic History Hysterectomy: 2000 with prior oophorectomies  Sexually active: no  Health Maintenance Last Pap: 2022 normal, HPV negative Last mammogram: 07/31/23. Results were: normal Last colonoscopy: 2015 Last Dexa: 10/23. Results were: osteopenia HRT use: previous  OB History     Gravida  0   Para  0   Term  0   Preterm  0   AB  0   Living  0      SAB  0   IAB  0   Ectopic  0   Multiple  0   Live Births  0               07/04/2023    8:56 AM 06/27/2023    3:47 PM 11/21/2022    3:58 PM  Depression screen PHQ 2/9  Decreased Interest 0 0 0  Down, Depressed, Hopeless 0 0 0  PHQ - 2 Score 0 0 0  Altered sleeping 0 3 0  Tired, decreased energy 0 2 0  Change in appetite 0 0 0  Feeling bad or failure about yourself  0 0 0  Trouble concentrating 0 0 0  Moving slowly or fidgety/restless 0 0 0  Suicidal thoughts 0 0 0  PHQ-9 Score 0 5 0  Difficult doing work/chores Not difficult at all       Past medical history, past surgical history, family history and social history were all reviewed and documented in the EPIC chart.  ROS:  A ROS was performed and pertinent positives and negatives are included.  Exam:  There were no vitals filed for this visit. There is no height or weight on file to calculate BMI.  General appearance:  Normal Thyroid:  Symmetrical, normal in size, without palpable masses or nodularity. Respiratory  Auscultation:  Clear without wheezing or rhonchi Cardiovascular  Auscultation:  Regular rate, without rubs, murmurs or gallops  Edema/varicosities:  Not grossly evident Abdominal  Soft,nontender, without masses, guarding or rebound.  Liver/spleen:  No organomegaly noted  Hernia:  None appreciated   Skin  Inspection:  Grossly normal Breasts: Examined lying and sitting.   Right: Without masses, retractions, nipple discharge or axillary adenopathy.   Left: Without masses, retractions, nipple discharge or axillary adenopathy. Genitourinary   Inguinal/mons:  Normal without inguinal adenopathy  External genitalia:  Normal appearing vulva with no masses, tenderness, or lesions  BUS/Urethra/Skene's glands:  Normal  Vagina:  Normal appearing with normal color and discharge, no lesions. Atrophy mild  Cervix:  absent  Uterus:  absent  Adnexa/parametria:     Rt: Normal in size, without masses or tenderness.   Lt: Normal in size, without masses or tenderness.  Anus and perineum: Normal   Darice Hoit, CMA present for exam  Assessment/Plan:   1. Well woman exam with routine gynecological exam (Primary) Pap 2027 Mammo, colonscopy up to date DEXA due 10/25  2. Menopausal symptoms Risks and benefits reviewed, will restart but switch to patch form, previously on oral - estradiol  (VIVELLE -DOT) 0.05 MG/24HR patch; Place 1 patch (0.05 mg total) onto the skin 2 (two) times a week.  Dispense: 24 patch; Refill: 4  3. Depression screen negative    Return in 1 year for annual or sooner prn.  GINETTE COZIER B WHNP-BC 3:33 PM 08/22/2023

## 2023-08-28 ENCOUNTER — Telehealth: Payer: Self-pay

## 2023-08-28 DIAGNOSIS — N951 Menopausal and female climacteric states: Secondary | ICD-10-CM

## 2023-08-28 MED ORDER — ESTRADIOL 0.05 MG/24HR TD PTWK: 0.0500 mg | MEDICATED_PATCH | TRANSDERMAL | 12 refills | Status: AC

## 2023-08-28 NOTE — Telephone Encounter (Signed)
 Prior authorization request received for Dotti  0.05mg  patch.  PA initiated.  Patient notified. KEY: [id number] DX: N95.1

## 2023-08-28 NOTE — Telephone Encounter (Signed)
 May try climara  patch once weekly

## 2023-08-28 NOTE — Telephone Encounter (Signed)
 Prior authorization for Dotti  0.05mg  patch:   Your request has been denied Denied.  Non-formulary Sent to [facility] NP for review.

## 2023-08-29 ENCOUNTER — Other Ambulatory Visit: Payer: Self-pay

## 2023-08-29 DIAGNOSIS — N951 Menopausal and female climacteric states: Secondary | ICD-10-CM

## 2023-08-29 MED ORDER — ESTRADIOL 0.05 MG/24HR TD PTTW: 1.0000 | MEDICATED_PATCH | TRANSDERMAL | 4 refills | Status: AC

## 2023-08-29 NOTE — Telephone Encounter (Signed)
 [facility] pharmacy filled estradiol  0.05 mg patch with Dotti  0.05 mg patch.  Insurance will not cover Dotti  0.05 mg patch.  Interpreter spoke to patient to see if she would like to change pharmacy.  Patient requests to change pharmacy to [facility].  RX sent to provider for approval.

## 2023-09-03 ENCOUNTER — Ambulatory Visit (INDEPENDENT_AMBULATORY_CARE_PROVIDER_SITE_OTHER): Admitting: *Deleted

## 2023-09-03 DIAGNOSIS — Z23 Encounter for immunization: Secondary | ICD-10-CM

## 2023-09-29 ENCOUNTER — Ambulatory Visit (HOSPITAL_COMMUNITY)
Admission: EM | Admit: 2023-09-29 | Discharge: 2023-09-29 | Disposition: A | Discharge status: Home / Self Care | Attending: Physician Assistant | Admitting: Physician Assistant

## 2023-09-29 ENCOUNTER — Encounter (HOSPITAL_COMMUNITY): Payer: Self-pay

## 2023-09-29 DIAGNOSIS — Z23 Encounter for immunization: Secondary | ICD-10-CM

## 2023-09-29 DIAGNOSIS — Z203 Contact with and (suspected) exposure to rabies: Secondary | ICD-10-CM | POA: Diagnosis not present

## 2023-09-29 DIAGNOSIS — W540XXA Bitten by dog, initial encounter: Secondary | ICD-10-CM

## 2023-09-29 MED ORDER — RABIES VACCINE, PCEC IM SUSR
INTRAMUSCULAR | Status: AC
  Filled 2023-09-29: qty 1

## 2023-09-29 MED ORDER — RABIES VACCINE, PCEC IM SUSR
1.0000 mL | Freq: Once | INTRAMUSCULAR | Status: AC
  Administered 2023-09-29: 1 mL via INTRAMUSCULAR

## 2023-09-29 MED ORDER — RABIES IMMUNE GLOBULIN 300 UNIT/2ML IJ SOLN
20.0000 [IU]/kg | Freq: Once | INTRAMUSCULAR | Status: AC
  Administered 2023-09-29: 1200 [IU]

## 2023-09-29 MED ORDER — RABIES IMMUNE GLOBULIN 300 UNIT/2ML IJ SOLN
20.0000 [IU]/kg | Freq: Once | INTRAMUSCULAR | Status: DC
Start: 2023-09-29 — End: 2023-09-29

## 2023-09-29 MED ORDER — RABIES IMMUNE GLOBULIN 1500 UNIT/10ML IJ SOLN
INTRAMUSCULAR | Status: AC
  Filled 2023-09-29: qty 10

## 2023-09-29 NOTE — ED Triage Notes (Signed)
 Patient states she was bite by unknown dog on the back of her upper right thigh x 4 days ago. Patient states she took antibiotic from [place] for 4 days.  Patient states pain and swelling.

## 2023-09-29 NOTE — ED Triage Notes (Signed)
 Last TDAP was 2023.

## 2023-09-29 NOTE — Discharge Instructions (Addendum)
 No orders of the defined types were placed in this encounter.   In addition to the human rabies immune globulin  (HRIG), you were given the first dose of the rabies vaccine  today (Day 0).  Please return here on the following dates to complete the rabies series: Day 3: 10/02/2023 Day 7: 10/06/2023 Day 14: 10/13/2023

## 2023-09-29 NOTE — ED Provider Notes (Signed)
 [facility]    CSN: [id number] Arrival date & time: 09/29/23  1019      History   Chief Complaint No chief complaint on file.   HPI Patricia Mitchell is a 63 y.o. female.   Patient presents with animal bite from a dog that occurred 5 days ago to the back of her left thigh.  She reports she was walking in her neighborhood and a dog she did not know bit the back of her leg.  She does not know the owners, unsure if dog is up-to-date on vaccines.  She reports taking an antibiotic that she had from [place].  Denies increased swelling, redness, drainage.  She does have bruising around the area.  Tdap is up-to-date.    Past Medical History:  Diagnosis Date   Endometriosis of ovary    Hyperlipidemia    Osteopenia    Osteoporosis     Patient Active Problem List   Diagnosis Date Noted   Age-related cataract of both eyes 01/24/2022   Chronic night sweats 01/24/2022   Adenomatous polyp 05/23/2013   Osteopenia 10/19/2011   Premature surgical menopause 10/19/2011   Hyperlipidemia 07/11/2011   Constipation 07/11/2011    Past Surgical History:  Procedure Laterality Date   CYST REMOVAL HAND Bilateral 02/07/2008   OOPHORECTOMY  1995, 1997   right taken out in 1995, left taken out in 1997 b/c of cysts   SUPRACERVICAL ABDOMINAL HYSTERECTOMY  2000    OB History     Gravida  0   Para  0   Term  0   Preterm  0   AB  0   Living  0      SAB  0   IAB  0   Ectopic  0   Multiple  0   Live Births  0            Home Medications    Prior to Admission medications   Medication Sig Start Date End Date Taking? Authorizing Provider  Calcium  Carb-Cholecalciferol (CALCIUM  + D3 PO) Take by mouth daily.   Yes [provider]  estradiol  (CLIMARA  - DOSED IN MG/24 HR) 0.05 mg/24hr patch Place 1 patch (0.05 mg total) onto the skin once a week. 08/28/23  Yes Chrzanowski, Jami B, NP  estradiol  (VIVELLE -DOT) 0.05 MG/24HR patch Place 1 patch (0.05 mg total) onto the  skin 2 (two) times a week. 08/30/23  Yes Chrzanowski, Jami B, NP  Misc Natural Products (JOINT SUPPORT PO) Take 2 capsules by mouth daily.   Yes [provider]  Multiple Vitamin (MULTI-VITAMIN) tablet Take 1 tablet by mouth daily. 07/11/18  Yes [provider]  rosuvastatin  (CRESTOR ) 5 MG tablet Take 1 tablet (5 mg total) by mouth daily. 07/05/23  Yes Theophilus Andrews, Tully GRADE, MD  vitamin E 200 UNIT capsule Take 200 Units by mouth daily.   Yes [provider]  albuterol  (VENTOLIN  HFA) 108 (90 Base) MCG/ACT inhaler Inhale 1-2 puffs into the lungs every 6 (six) hours as needed for wheezing or shortness of breath. Patient not taking: Reported on 08/22/2023 12/24/21   Raspet, Rocky POUR, PA-C    Family History Family History  Problem Relation Age of Onset   Prostate cancer Father    Cervical cancer Mother 101       dec   Colon cancer Neg Hx     Social History Social History   Tobacco Use   Smoking status: Never    Passive exposure: Never  Smokeless tobacco: Never  Vaping Use   Vaping status: Never Used  Substance Use Topics   Alcohol use: No   Drug use: No     Allergies   Patient has no known allergies.   Review of Systems Review of Systems  Constitutional:  Negative for chills and fever.  HENT:  Negative for ear pain and sore throat.   Eyes:  Negative for pain and visual disturbance.  Respiratory:  Negative for cough and shortness of breath.   Cardiovascular:  Negative for chest pain and palpitations.  Gastrointestinal:  Negative for abdominal pain and vomiting.  Genitourinary:  Negative for dysuria and hematuria.  Musculoskeletal:  Negative for arthralgias and back pain.  Skin:  Positive for wound. Negative for color change and rash.  Neurological:  Negative for seizures and syncope.  All other systems reviewed and are negative.    Physical Exam Triage Vital Signs ED Triage Vitals  Encounter Vitals Group     BP 09/29/23 1128 122/74      Girls Systolic BP Percentile --      Girls Diastolic BP Percentile --      Boys Systolic BP Percentile --      Boys Diastolic BP Percentile --      Pulse Rate 09/29/23 1128 72     Resp 09/29/23 1128 18     Temp 09/29/23 1128 98.3 F (36.8 C)     Temp Source 09/29/23 1128 Oral     SpO2 09/29/23 1128 96 %     Weight --      Height --      Head Circumference --      Peak Flow --      Pain Score 09/29/23 1131 6     Pain Loc --      Pain Education --      Exclude from Growth Chart --    No data found.  Updated Vital Signs BP 122/74 (BP Location: Left Arm)   Pulse 72   Temp 98.3 F (36.8 C) (Oral)   Resp 18   SpO2 96%   Visual Acuity Right Eye Distance:   Left Eye Distance:   Bilateral Distance:    Right Eye Near:   Left Eye Near:    Bilateral Near:     Physical Exam Vitals and nursing note reviewed.  Constitutional:      General: She is not in acute distress.    Appearance: She is well-developed.  HENT:     Head: Normocephalic and atraumatic.  Eyes:     Conjunctiva/sclera: Conjunctivae normal.  Cardiovascular:     Rate and Rhythm: Normal rate and regular rhythm.     Heart sounds: No murmur heard. Pulmonary:     Effort: Pulmonary effort is normal. No respiratory distress.     Breath sounds: Normal breath sounds.  Abdominal:     Palpations: Abdomen is soft.     Tenderness: There is no abdominal tenderness.  Musculoskeletal:        General: No swelling.     Cervical back: Neck supple.  Skin:    General: Skin is warm and dry.     Capillary Refill: Capillary refill takes less than 2 seconds.      Neurological:     Mental Status: She is alert.  Psychiatric:        Mood and Affect: Mood normal.      UC Treatments / Results  Labs (all labs ordered are listed, but only abnormal results are  displayed) Labs Reviewed - No data to display  EKG   Radiology No results found.  Procedures Procedures (including critical care time)  Medications Ordered in  UC Medications  rabies immune globulin  (HYPERRAB) injection 20 Units/kg (has no administration in time range)  rabies vaccine  (RABAVERT ) injection 1 mL (has no administration in time range)    Initial Impression / Assessment and Plan / UC Course  I have reviewed the triage vital signs and the nursing notes.  Pertinent labs & imaging results that were available during my care of the patient were reviewed by me and considered in my medical decision making (see chart for details).     Dog bite.  Wound looks to be healing well.  No signs of infection.  No indication for antibiotics at this time.  Will initiate rabies prophylaxis given unknown vaccination status of the dog. Final Clinical Impressions(s) / UC Diagnoses   Final diagnoses:  Dog bite, initial encounter  Need for post exposure prophylaxis for rabies     Discharge Instructions      No orders of the defined types were placed in this encounter.   In addition to the human rabies immune globulin  (HRIG), you were given the first dose of the rabies vaccine  today (Day 0).  Please return here on the following dates to complete the rabies series: Day 3: 10/02/2023 Day 7: 10/06/2023 Day 14: 10/13/2023    ED Prescriptions   None    PDMP not reviewed this encounter.   Ward, Harlene PEDLAR, PA-C 09/29/23 1320

## 2023-10-02 ENCOUNTER — Encounter (HOSPITAL_COMMUNITY): Payer: Self-pay

## 2023-10-02 ENCOUNTER — Ambulatory Visit (HOSPITAL_COMMUNITY)
Admission: EM | Admit: 2023-10-02 | Discharge: 2023-10-02 | Disposition: A | Discharge status: Home / Self Care | Attending: Family Medicine | Admitting: Family Medicine

## 2023-10-02 DIAGNOSIS — Z23 Encounter for immunization: Secondary | ICD-10-CM

## 2023-10-02 DIAGNOSIS — Z203 Contact with and (suspected) exposure to rabies: Secondary | ICD-10-CM | POA: Diagnosis not present

## 2023-10-02 MED ORDER — RABIES VACCINE, PCEC IM SUSR
1.0000 mL | Freq: Once | INTRAMUSCULAR | Status: AC
  Administered 2023-10-02: 1 mL via INTRAMUSCULAR

## 2023-10-02 MED ORDER — RABIES VACCINE, PCEC IM SUSR
INTRAMUSCULAR | Status: AC
  Filled 2023-10-02: qty 1

## 2023-10-02 NOTE — ED Triage Notes (Signed)
 Pt here for rabies vaccine .  Denies any reaction to previous vaccine.

## 2023-10-06 ENCOUNTER — Other Ambulatory Visit: Payer: Self-pay

## 2023-10-06 ENCOUNTER — Ambulatory Visit (HOSPITAL_COMMUNITY)
Admission: EM | Admit: 2023-10-06 | Discharge: 2023-10-06 | Disposition: A | Discharge status: Home / Self Care | Attending: Family Medicine | Admitting: Family Medicine

## 2023-10-06 ENCOUNTER — Encounter (HOSPITAL_COMMUNITY): Payer: Self-pay | Admitting: Emergency Medicine

## 2023-10-06 DIAGNOSIS — Z203 Contact with and (suspected) exposure to rabies: Secondary | ICD-10-CM | POA: Diagnosis not present

## 2023-10-06 DIAGNOSIS — Z23 Encounter for immunization: Secondary | ICD-10-CM

## 2023-10-06 MED ORDER — RABIES VACCINE, PCEC IM SUSR
INTRAMUSCULAR | Status: AC
  Filled 2023-10-06: qty 1

## 2023-10-06 MED ORDER — RABIES VACCINE, PCEC IM SUSR
1.0000 mL | Freq: Once | INTRAMUSCULAR | Status: AC
  Administered 2023-10-06: 1 mL via INTRAMUSCULAR

## 2023-10-06 NOTE — ED Triage Notes (Signed)
 Pt here for there 3rd dose of rabies vaccine . Pt denies any adverse reaction to prior doses of the vaccines.

## 2023-10-13 ENCOUNTER — Ambulatory Visit (HOSPITAL_COMMUNITY)
Admission: EM | Admit: 2023-10-13 | Discharge: 2023-10-13 | Disposition: A | Discharge status: Home / Self Care | Attending: Family Medicine | Admitting: Family Medicine

## 2023-10-13 DIAGNOSIS — Z203 Contact with and (suspected) exposure to rabies: Secondary | ICD-10-CM | POA: Diagnosis not present

## 2023-10-13 DIAGNOSIS — Z23 Encounter for immunization: Secondary | ICD-10-CM | POA: Diagnosis not present

## 2023-10-13 MED ORDER — RABIES VACCINE, PCEC IM SUSR
1.0000 mL | Freq: Once | INTRAMUSCULAR | Status: AC
  Administered 2023-10-13: 1 mL via INTRAMUSCULAR

## 2023-10-13 MED ORDER — RABIES VACCINE, PCEC IM SUSR
INTRAMUSCULAR | Status: AC
  Filled 2023-10-13: qty 1

## 2023-10-13 NOTE — ED Triage Notes (Signed)
 Patient is here for last rabies injection.  Given in Left deltoid.  NDC #[id number].

## 2023-12-12 ENCOUNTER — Ambulatory Visit (HOSPITAL_COMMUNITY)
Admission: EM | Admit: 2023-12-12 | Discharge: 2023-12-12 | Disposition: A | Discharge status: Home / Self Care | Attending: Physician Assistant | Admitting: Physician Assistant

## 2023-12-12 ENCOUNTER — Encounter (HOSPITAL_COMMUNITY): Payer: Self-pay

## 2023-12-12 DIAGNOSIS — J309 Allergic rhinitis, unspecified: Secondary | ICD-10-CM | POA: Diagnosis not present

## 2023-12-12 MED ORDER — CETIRIZINE HCL 10 MG PO CAPS: 10.0000 mg | ORAL_CAPSULE | Freq: Every day | ORAL | 2 refills | Status: AC

## 2023-12-12 MED ORDER — FLUTICASONE PROPIONATE 50 MCG/ACT NA SUSP: 1.0000 | Freq: Every day | NASAL | 1 refills | Status: AC

## 2023-12-12 NOTE — ED Provider Notes (Signed)
 [facility]    CSN: [id number] Arrival date & time: 12/12/23  1406      History   Chief Complaint Chief Complaint  Patient presents with   Cough    HPI Patricia Mitchell is a 63 y.o. female.  has a past medical history of Endometriosis of ovary, Hyperlipidemia, Osteopenia, and Osteoporosis.   HPI  Spanish interpretor utilized for encounter: Lucienne # [phone]  Discussed the use of AI scribe software for clinical note transcription with the patient, who gave verbal consent to proceed.   The patient presents with a two-week history of cough and throat discomfort.  The cough began with an itchy and scratchy sensation in the throat and is more pronounced at night, accompanied by phlegm production. This morning, she experienced pain in the right ear.  No fever, chills, nausea, vomiting, body aches, or rashes. She has nasal congestion on the right side, but only at night. There is no history of recent travel or similar symptoms in close contacts.  She has been taking an allergy medication from [place], which provided minimal relief, but she does not recall the name of the medication.   Past Medical History:  Diagnosis Date   Endometriosis of ovary    Hyperlipidemia    Osteopenia    Osteoporosis     Patient Active Problem List   Diagnosis Date Noted   Age-related cataract of both eyes 01/24/2022   Chronic night sweats 01/24/2022   Adenomatous polyp 05/23/2013   Osteopenia 10/19/2011   Premature surgical menopause 10/19/2011   Hyperlipidemia 07/11/2011   Constipation 07/11/2011    Past Surgical History:  Procedure Laterality Date   CYST REMOVAL HAND Bilateral 02/07/2008   OOPHORECTOMY  1995, 1997   right taken out in 1995, left taken out in 1997 b/c of cysts   SUPRACERVICAL ABDOMINAL HYSTERECTOMY  2000    OB History     Gravida  0   Para  0   Term  0   Preterm  0   AB  0   Living  0      SAB  0   IAB  0   Ectopic  0   Multiple  0    Live Births  0            Home Medications    Prior to Admission medications   Medication Sig Start Date End Date Taking? Authorizing Provider  Cetirizine  HCl 10 MG CAPS Take 1 capsule (10 mg total) by mouth daily. 12/12/23  Yes Donnabelle Blanchard E, PA-C  fluticasone  (FLONASE ) 50 MCG/ACT nasal spray Place 1 spray into both nostrils daily. 12/12/23  Yes Lindaann Gradilla E, PA-C  Calcium  Carb-Cholecalciferol (CALCIUM  + D3 PO) Take by mouth daily.    [provider]  estradiol  (CLIMARA  - DOSED IN MG/24 HR) 0.05 mg/24hr patch Place 1 patch (0.05 mg total) onto the skin once a week. 08/28/23   Chrzanowski, Jami B, NP  estradiol  (VIVELLE -DOT) 0.05 MG/24HR patch Place 1 patch (0.05 mg total) onto the skin 2 (two) times a week. 08/30/23   Chrzanowski, Jami B, NP  Misc Natural Products (JOINT SUPPORT PO) Take 2 capsules by mouth daily.    [provider]  Multiple Vitamin (MULTI-VITAMIN) tablet Take 1 tablet by mouth daily. 07/11/18   [provider]  rosuvastatin  (CRESTOR ) 5 MG tablet Take 1 tablet (5 mg total) by mouth daily. 07/05/23   Theophilus Andrews, Tully GRADE, MD  vitamin E 200 UNIT capsule Take 200 Units  by mouth daily.    [provider]    Family History Family History  Problem Relation Age of Onset   Prostate cancer Father    Cervical cancer Mother 55       dec   Colon cancer Neg Hx     Social History Social History   Tobacco Use   Smoking status: Never    Passive exposure: Never   Smokeless tobacco: Never  Vaping Use   Vaping status: Never Used  Substance Use Topics   Alcohol use: No   Drug use: No     Allergies   Patient has no known allergies.   Review of Systems Review of Systems  Constitutional:  Negative for chills and fever.  HENT:  Positive for congestion, ear pain, postnasal drip and sore throat. Negative for rhinorrhea, sinus pressure and sinus pain.   Respiratory:  Positive for cough (productive coughing with phlegm). Negative for  shortness of breath and wheezing.   Gastrointestinal:  Negative for diarrhea, nausea and vomiting.  Musculoskeletal:  Negative for myalgias.  Skin:  Negative for rash.  Neurological:  Negative for dizziness, light-headedness and headaches.     Physical Exam Triage Vital Signs ED Triage Vitals  Encounter Vitals Group     BP 12/12/23 1555 126/72     Girls Systolic BP Percentile --      Girls Diastolic BP Percentile --      Boys Systolic BP Percentile --      Boys Diastolic BP Percentile --      Pulse Rate 12/12/23 1555 73     Resp 12/12/23 1555 16     Temp 12/12/23 1555 98.3 F (36.8 C)     Temp Source 12/12/23 1555 Oral     SpO2 12/12/23 1555 95 %     Weight --      Height --      Head Circumference --      Peak Flow --      Pain Score 12/12/23 1552 8     Pain Loc --      Pain Education --      Exclude from Growth Chart --    No data found.  Updated Vital Signs BP 126/72 (BP Location: Right Arm)   Pulse 73   Temp 98.3 F (36.8 C) (Oral)   Resp 16   SpO2 95%   Visual Acuity Right Eye Distance:   Left Eye Distance:   Bilateral Distance:    Right Eye Near:   Left Eye Near:    Bilateral Near:     Physical Exam Vitals reviewed.  Constitutional:      General: She is awake. She is not in acute distress.    Appearance: Normal appearance. She is well-developed and well-groomed. She is not ill-appearing, toxic-appearing or diaphoretic.  HENT:     Head: Normocephalic and atraumatic.     Right Ear: Hearing, tympanic membrane and ear canal normal.     Left Ear: Hearing, tympanic membrane and ear canal normal.     Mouth/Throat:     Lips: Pink.     Mouth: Mucous membranes are moist.     Pharynx: Oropharynx is clear. Uvula midline. No pharyngeal swelling, oropharyngeal exudate, posterior oropharyngeal erythema, uvula swelling or postnasal drip.  Cardiovascular:     Rate and Rhythm: Normal rate and regular rhythm.     Pulses: Normal pulses.          Radial pulses are  2+ on the right side  and 2+ on the left side.     Heart sounds: Normal heart sounds. No murmur heard.    No friction rub. No gallop.  Pulmonary:     Effort: Pulmonary effort is normal.     Breath sounds: Normal breath sounds. No decreased air movement. No decreased breath sounds, wheezing, rhonchi or rales.  Musculoskeletal:     Cervical back: Normal range of motion and neck supple.  Lymphadenopathy:     Head:     Right side of head: No submental, submandibular or preauricular adenopathy.     Left side of head: No submental, submandibular or preauricular adenopathy.     Cervical:     Right cervical: No superficial cervical adenopathy.    Left cervical: No superficial cervical adenopathy.     Upper Body:     Right upper body: No supraclavicular adenopathy.     Left upper body: No supraclavicular adenopathy.  Skin:    General: Skin is warm and dry.  Neurological:     General: No focal deficit present.     Mental Status: She is alert and oriented to person, place, and time.  Psychiatric:        Mood and Affect: Mood normal.        Behavior: Behavior normal. Behavior is cooperative.        Thought Content: Thought content normal.        Judgment: Judgment normal.      UC Treatments / Results  Labs (all labs ordered are listed, but only abnormal results are displayed) Labs Reviewed - No data to display  EKG   Radiology No results found.  Procedures Procedures (including critical care time)  Medications Ordered in UC Medications - No data to display  Initial Impression / Assessment and Plan / UC Course  I have reviewed the triage vital signs and the nursing notes.  Pertinent labs & imaging results that were available during my care of the patient were reviewed by me and considered in my medical decision making (see chart for details).      Final Clinical Impressions(s) / UC Diagnoses   Final diagnoses:  Allergic rhinitis, unspecified seasonality, unspecified  trigger   Allergic rhinitis Symptoms consistent with allergic rhinitis, including cough, itchy throat, and right ear pain, persisting for two weeks. Symptoms are more pronounced at night. No fever, chills, nausea, vomiting, body aches, or rashes. No recent travel or similar symptoms in close contacts. Previous allergy medication was ineffective. No evidence of strep throat, upper respiratory tract infection, or sinus infection. - Prescribed Zyrtec  once daily. - Prescribed nasal spray to be used twice daily. ]    Discharge Instructions      VISIT SUMMARY:  You came in today with a two-week history of cough and throat discomfort. Your symptoms include an itchy and scratchy throat, more pronounced at night, with phlegm production and right ear pain. You also have nasal congestion on the right side at night. There is no fever, chills, nausea, vomiting, body aches, or rashes, and no recent travel or similar symptoms in close contacts.  YOUR PLAN:  -ALLERGIC RHINITIS: Allergic rhinitis is an allergic reaction that causes sneezing, congestion, and a runny nose. Your symptoms are consistent with this condition. We have prescribed Zyrtec  to be taken once daily and a nasal spray to be used twice daily. Your prescriptions have been sent to [facility] on [address].  INSTRUCTIONS:  Please follow up if your symptoms do not improve or if they worsen. Continue  taking the prescribed medications as directed.  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 Sus sntomas incluyen picazn e irritacin de garganta, ms pronunciada por la noche, con produccin de flema y dolor en el odo derecho. Tambin presenta congestin nasal del lado derecho por la noche. No tiene fiebre, escalofros, nuseas, vmitos, dolores corporales ni erupciones cutneas, y no ha viajado recientemente ni ha tenido contacto cercano con personas que presenten sntomas  similares.  SU PLAN:  -RINITIS ALRGICA: La rinitis alrgica es una reaccin alrgica que causa estornudos, congestin y secrecin nasal. Sus sntomas coinciden con esta afeccin. Le hemos recetado Zyrtec  para tomar una vez al da y un aerosol nasal para scientist, research (physical sciences). Sus recetas se han enviado a Teacher, Music.  INSTRUCCIONES:  Por favor, acuda a consulta si sus sntomas no mejoran o si empeoran. Contine tomando los medicamentos recetados segn las indicaciones.      ED Prescriptions     Medication Sig Dispense Auth. Provider   Cetirizine  HCl 10 MG CAPS Take 1 capsule (10 mg total) by mouth daily. 30 capsule Bostyn Bogie E, PA-C   fluticasone  (FLONASE ) 50 MCG/ACT nasal spray Place 1 spray into both nostrils daily. 11.1 mL Jelene Albano E, PA-C      PDMP not reviewed this encounter.   Marylene Rocky BRAVO, PA-C 12/12/23 1626

## 2023-12-12 NOTE — ED Triage Notes (Signed)
 Patient here today with c/o cough, ST, and right ear pain X 2 weeks. Cough is worse at night. Patient has taken something for allergies that she got from [place] but has not helped. No known sick contacts.

## 2023-12-12 NOTE — Discharge Instructions (Addendum)
 VISIT SUMMARY:  You came in today with a two-week history of cough and throat discomfort. Your symptoms include an itchy and scratchy throat, more pronounced at night, with phlegm production and right ear pain. You also have nasal congestion on the right side at night. There is no fever, chills, nausea, vomiting, body aches, or rashes, and no recent travel or similar symptoms in close contacts.  YOUR PLAN:  -ALLERGIC RHINITIS: Allergic rhinitis is an allergic reaction that causes sneezing, congestion, and a runny nose. Your symptoms are consistent with this condition. We have prescribed Zyrtec  to be taken once daily and a nasal spray to be used twice daily. Your prescriptions have been sent to [facility] on [address].  INSTRUCTIONS:  Please follow up if your symptoms do not improve or if they worsen. Continue taking the prescribed medications as directed.  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 Sus sntomas incluyen picazn e irritacin de garganta, ms pronunciada por la noche, con produccin de flema y dolor en el odo derecho. Tambin presenta congestin nasal del lado derecho por la noche. No tiene fiebre, escalofros, nuseas, vmitos, dolores corporales ni erupciones cutneas, y no ha viajado recientemente ni ha tenido contacto cercano con personas que presenten sntomas similares.  SU PLAN:  -RINITIS ALRGICA: La rinitis alrgica es una reaccin alrgica que causa estornudos, congestin y secrecin nasal. Sus sntomas coinciden con esta afeccin. Le hemos recetado Zyrtec  para tomar una vez al da y un aerosol nasal para usar dos veces al da. Sus recetas se han enviado a Teacher, Music.  INSTRUCCIONES:  Por favor, acuda a consulta si sus sntomas no mejoran o si empeoran. Contine tomando los medicamentos recetados segn las indicaciones.

## 2024-02-05 LAB — HM DEXA SCAN: HM Dexa Scan: NORMAL

## 2024-02-21 ENCOUNTER — Telehealth: Payer: Self-pay

## 2024-02-21 NOTE — Telephone Encounter (Signed)
 Prior authorization for Estradiol  0.05 mg patch initiated through covermymeds. KEY: [id number] DX: [id number]

## 2024-02-22 ENCOUNTER — Telehealth: Payer: Self-pay

## 2024-02-22 NOTE — Telephone Encounter (Signed)
 [employer] Next fax received for estradiol  0.05 mg patch prior authorization: Prior authorization for estradiol  0.05 mg patch not needed.  The drug is preferred and does not require prior authorization.

## 2024-03-06 ENCOUNTER — Encounter: Payer: Self-pay | Admitting: Internal Medicine

## 2024-03-06 ENCOUNTER — Ambulatory Visit: Admitting: Internal Medicine

## 2024-03-06 VITALS — BP 120/70 | HR 70 | Temp 98.1°F | Wt 150.8 lb

## 2024-03-06 DIAGNOSIS — R1031 Right lower quadrant pain: Secondary | ICD-10-CM

## 2024-03-06 LAB — POC URINALSYSI DIPSTICK (AUTOMATED)
Bilirubin, UA: NEGATIVE
Blood, UA: POSITIVE
Glucose, UA: NEGATIVE
Ketones, UA: NEGATIVE
Leukocytes, UA: NEGATIVE
Nitrite, UA: NEGATIVE
Protein, UA: NEGATIVE
Spec Grav, UA: <=1.005 — AB
Urobilinogen, UA: 0.2 U/dL
pH, UA: 6

## 2024-03-06 LAB — URINALYSIS
Bilirubin Urine: NEGATIVE
Ketones, ur: NEGATIVE
Leukocytes,Ua: NEGATIVE
Nitrite: NEGATIVE
Specific Gravity, Urine: <=1.005 — AB (ref 1.000–1.030)
Total Protein, Urine: NEGATIVE
Urine Glucose: NEGATIVE
Urobilinogen, UA: 0.2 (ref 0.0–1.0)
pH: 6 (ref 5.0–8.0)

## 2024-03-06 MED ORDER — SULFAMETHOXAZOLE-TRIMETHOPRIM 800-160 MG PO TABS: 1.0000 | ORAL_TABLET | Freq: Two times a day (BID) | ORAL | 0 refills | Status: AC

## 2024-03-06 NOTE — Progress Notes (Signed)
 "    Established Patient Office Visit     CC/Reason for Visit: Right lower quadrant pain  HPI: Patricia Mitchell is a 64 y.o. female who is coming in today for the above mentioned reasons.  Has been ongoing for the past 2 weeks or so.  No urinary symptoms that she could recall.  Pain started around her right flank but has migrated down to her right lower quadrant area.  No nausea, no vomiting, no diarrhea.  No GERD/dyspepsia type symptoms.  No association with food.  No muscle injury that she can recall.   Past Medical/Surgical History: Past Medical History:  Diagnosis Date   Endometriosis of ovary    Hyperlipidemia    Osteopenia    Osteoporosis     Past Surgical History:  Procedure Laterality Date   CYST REMOVAL HAND Bilateral 02/07/2008   OOPHORECTOMY  1995, 1997   right taken out in 1995, left taken out in 1997 b/c of cysts   SUPRACERVICAL ABDOMINAL HYSTERECTOMY  2000    Social History:  reports that she has never smoked. She has never been exposed to tobacco smoke. She has never used smokeless tobacco. She reports that she does not drink alcohol and does not use drugs.  Allergies: Allergies[1]  Family History:  Family History  Problem Relation Age of Onset   Prostate cancer Father    Cervical cancer Mother 54       dec   Colon cancer Neg Hx     Current Medications[2]  Review of Systems:  Negative unless indicated in HPI.   Physical Exam: Vitals:   03/06/24 1034  BP: 120/70  Pulse: 70  Temp: 98.1 F (36.7 C)  TempSrc: Oral  SpO2: 99%  Weight: 150 lb 12.8 oz (68.4 kg)    Body mass index is 26.55 kg/m.   Physical Exam Abdominal:     General: Abdomen is flat. Bowel sounds are normal. There is no distension.     Palpations: Abdomen is soft.     Tenderness: There is abdominal tenderness in the right lower quadrant.     Hernia: There is no hernia in the right inguinal area.       Impression and Plan:  RLQ abdominal pain -     POCT Urinalysis  Dipstick (Automated) -     Urinalysis; Future -     Urine Culture; Future -     Sulfamethoxazole -Trimethoprim ; Take 1 tablet by mouth 2 (two) times daily for 5 days.  Dispense: 10 tablet; Refill: 0 -     US  RENAL; Future   - Pain appears to be right inguinal/right lower quadrant area.  No signs of inguinal hernia.  She is found to have blood in her urine so wonder about kidney stones.  Will treat with 5 days of Bactrim  and send for renal ultrasound.  Time spent:23 minutes reviewing chart, interviewing and examining patient and formulating plan of care.     Tully Theophilus Andrews, MD [facility]     [1] No Known Allergies [2]  Current Outpatient Medications:    Calcium  Carb-Cholecalciferol (CALCIUM  + D3 PO), Take by mouth daily., Disp: , Rfl:    Cetirizine  HCl 10 MG CAPS, Take 1 capsule (10 mg total) by mouth daily., Disp: 30 capsule, Rfl: 2   estradiol  (CLIMARA  - DOSED IN MG/24 HR) 0.05 mg/24hr patch, Place 1 patch (0.05 mg total) onto the skin once a week., Disp: 4 patch, Rfl: 12   estradiol  (VIVELLE -DOT) 0.05 MG/24HR patch,  Place 1 patch (0.05 mg total) onto the skin 2 (two) times a week., Disp: 24 patch, Rfl: 4   fluticasone  (FLONASE ) 50 MCG/ACT nasal spray, Place 1 spray into both nostrils daily., Disp: 11.1 mL, Rfl: 1   Misc Natural Products (JOINT SUPPORT PO), Take 2 capsules by mouth daily., Disp: , Rfl:    Multiple Vitamin (MULTI-VITAMIN) tablet, Take 1 tablet by mouth daily., Disp: , Rfl:    rosuvastatin  (CRESTOR ) 5 MG tablet, Take 1 tablet (5 mg total) by mouth daily., Disp: 90 tablet, Rfl: 1   sulfamethoxazole -trimethoprim  (BACTRIM  DS) 800-160 MG tablet, Take 1 tablet by mouth 2 (two) times daily for 5 days., Disp: 10 tablet, Rfl: 0   vitamin E 200 UNIT capsule, Take 200 Units by mouth daily., Disp: , Rfl:   "

## 2024-03-07 LAB — URINE CULTURE
MICRO NUMBER:: 17526729
Result:: NO GROWTH
SPECIMEN QUALITY:: ADEQUATE
